# Patient Record
Sex: Male | Born: 1975
Health system: Southern US, Community
[De-identification: ages and names within clinical notes are randomized; demographics above are authoritative.]

## PROBLEM LIST (undated history)

## (undated) DIAGNOSIS — M1990 Unspecified osteoarthritis, unspecified site: Secondary | ICD-10-CM

## (undated) DIAGNOSIS — K219 Gastro-esophageal reflux disease without esophagitis: Secondary | ICD-10-CM

## (undated) DIAGNOSIS — I10 Essential (primary) hypertension: Secondary | ICD-10-CM

## (undated) HISTORY — DX: Unspecified osteoarthritis, unspecified site: M19.90

## (undated) HISTORY — DX: Gastro-esophageal reflux disease without esophagitis: K21.9

## (undated) HISTORY — PX: WRIST FRACTURE SURGERY: SHX121

---

## 1999-03-01 ENCOUNTER — Emergency Department (HOSPITAL_COMMUNITY): Admission: EM | Admit: 1999-03-01 | Discharge: 1999-03-01 | Payer: Self-pay | Admitting: Emergency Medicine

## 2002-06-14 ENCOUNTER — Emergency Department (HOSPITAL_COMMUNITY): Admission: EM | Admit: 2002-06-14 | Discharge: 2002-06-14 | Payer: Self-pay | Admitting: Emergency Medicine

## 2002-06-14 ENCOUNTER — Encounter: Payer: Self-pay | Admitting: Emergency Medicine

## 2009-08-27 ENCOUNTER — Emergency Department (HOSPITAL_COMMUNITY): Admission: EM | Admit: 2009-08-27 | Discharge: 2009-08-28 | Payer: Self-pay | Admitting: Emergency Medicine

## 2010-03-16 ENCOUNTER — Emergency Department (HOSPITAL_COMMUNITY): Admission: EM | Admit: 2010-03-16 | Discharge: 2010-03-16 | Payer: Self-pay | Admitting: Emergency Medicine

## 2010-09-20 LAB — DIFFERENTIAL
Basophils Absolute: 0 10*3/uL (ref 0.0–0.1)
Basophils Relative: 0 % (ref 0–1)
Eosinophils Absolute: 0 10*3/uL (ref 0.0–0.7)
Eosinophils Relative: 1 % (ref 0–5)
Lymphocytes Relative: 32 % (ref 12–46)
Lymphs Abs: 2 10*3/uL (ref 0.7–4.0)
Monocytes Absolute: 0.3 10*3/uL (ref 0.1–1.0)
Monocytes Relative: 5 % (ref 3–12)
Neutro Abs: 3.8 10*3/uL (ref 1.7–7.7)
Neutrophils Relative %: 62 % (ref 43–77)

## 2010-09-20 LAB — COMPREHENSIVE METABOLIC PANEL
ALT: 21 U/L (ref 0–53)
AST: 28 U/L (ref 0–37)
Albumin: 4.1 g/dL (ref 3.5–5.2)
Alkaline Phosphatase: 84 U/L (ref 39–117)
BUN: 8 mg/dL (ref 6–23)
CO2: 24 mEq/L (ref 19–32)
Calcium: 8.5 mg/dL (ref 8.4–10.5)
Chloride: 104 mEq/L (ref 96–112)
Creatinine, Ser: 1.05 mg/dL (ref 0.4–1.5)
GFR calc Af Amer: >60 mL/min (ref >60)
GFR calc non Af Amer: >60 mL/min (ref >60)
Glucose, Bld: 117 mg/dL — ABNORMAL HIGH (ref 70–99)
Potassium: 3.5 mEq/L (ref 3.5–5.1)
Sodium: 137 mEq/L (ref 135–145)
Total Bilirubin: 0.7 mg/dL (ref 0.3–1.2)
Total Protein: 7.4 g/dL (ref 6.0–8.3)

## 2010-09-20 LAB — CBC
HCT: 38.8 % — ABNORMAL LOW (ref 39.0–52.0)
Hemoglobin: 13.5 g/dL (ref 13.0–17.0)
MCHC: 34.7 g/dL (ref 30.0–36.0)
MCV: 94.7 fL (ref 78.0–100.0)
Platelets: 217 10*3/uL (ref 150–400)
RBC: 4.1 MIL/uL — ABNORMAL LOW (ref 4.22–5.81)
RDW: 14.3 % (ref 11.5–15.5)
WBC: 6.2 10*3/uL (ref 4.0–10.5)

## 2010-09-20 LAB — ETHANOL: Alcohol, Ethyl (B): 226 mg/dL — ABNORMAL HIGH (ref 0–10)

## 2010-09-20 LAB — APTT: aPTT: 29 seconds (ref 24–37)

## 2010-11-22 ENCOUNTER — Ambulatory Visit (INDEPENDENT_AMBULATORY_CARE_PROVIDER_SITE_OTHER): Payer: Managed Care, Other (non HMO) | Admitting: Internal Medicine

## 2010-11-22 ENCOUNTER — Encounter: Payer: Self-pay | Admitting: Internal Medicine

## 2010-11-22 VITALS — BP 148/100 | HR 85 | Ht 70.0 in | Wt 218.0 lb

## 2010-11-22 DIAGNOSIS — Z0000 Encounter for general adult medical examination without abnormal findings: Secondary | ICD-10-CM

## 2010-11-22 DIAGNOSIS — Z23 Encounter for immunization: Secondary | ICD-10-CM

## 2010-11-22 LAB — BASIC METABOLIC PANEL
CO2: 28 mEq/L (ref 19–32)
Calcium: 9.4 mg/dL (ref 8.4–10.5)
GFR: 97.77 mL/min (ref >60.00)
Sodium: 140 mEq/L (ref 135–145)

## 2010-11-22 LAB — LIPID PANEL
Cholesterol: 205 mg/dL — ABNORMAL HIGH (ref 0–200)
HDL: 67.1 mg/dL (ref >39.00)
Triglycerides: 95 mg/dL (ref 0.0–149.0)

## 2010-11-23 LAB — LDL CHOLESTEROL, DIRECT: Direct LDL: 147.1 mg/dL

## 2010-11-30 ENCOUNTER — Telehealth: Payer: Self-pay

## 2010-11-30 NOTE — Telephone Encounter (Signed)
Left message for pt to call back  °

## 2010-11-30 NOTE — Telephone Encounter (Signed)
Message copied by Beverely Low on Fri Nov 30, 2010  2:18 PM ------      Message from: Staci Righter      Created: Thu Nov 29, 2010  9:18 PM       Chol mildly elevated. Low fat diet and regular exercise.

## 2010-11-30 NOTE — Telephone Encounter (Signed)
Pt's wife aware.

## 2010-11-30 NOTE — Telephone Encounter (Signed)
Message copied by Beverely Low on Fri Nov 30, 2010  3:51 PM ------      Message from: Staci Righter      Created: Thu Nov 29, 2010  9:18 PM       Chol mildly elevated. Low fat diet and regular exercise.

## 2010-12-02 ENCOUNTER — Encounter: Payer: Self-pay | Admitting: Internal Medicine

## 2010-12-02 DIAGNOSIS — Z0000 Encounter for general adult medical examination without abnormal findings: Secondary | ICD-10-CM | POA: Insufficient documentation

## 2010-12-02 NOTE — Assessment & Plan Note (Signed)
Normal exam. Reviewed normal CBC Chem-12 ESR and chest x-ray dated September 2011. Obtain Chem-7 and fasting lipid profile today. Tetanus booster provided.

## 2010-12-02 NOTE — Progress Notes (Signed)
  Subjective:    Patient ID: Eugene Franco, male    DOB: 1976/05/01, 35 y.o.   MRN: [id number]  HPI Pt presents to clinic to establish primary care and for CPE. meds history of intermittent low back pain with associated stiffness. No history of injury, or radicular pain, numbness tingling or leg weakness. Currently asymptomatic. Back x-ray dated 2003 demonstrated DDD of L4 and L5 as well as L5 and S1 and mild scoliosis. Currently is asymptomatic and is without complaint.  Reviewed past medical history, past surgical history, medications, allergies, social history and family history    Review of Systems  Musculoskeletal: Positive for back pain.  Neurological: Negative for weakness and numbness.  [all other systems reviewed and are negative       Objective:   Physical Exam    Physical Exam  Vitals reviewed. Constitutional:  appears well-developed and well-nourished. No distress.  HENT:  Head: Normocephalic and atraumatic.  Right Ear: Tympanic membrane, external ear and ear canal normal.  Left Ear: Tympanic membrane, external ear and ear canal normal.  Nose: Nose normal.  Mouth/Throat: Oropharynx is clear and moist. No oropharyngeal exudate.  Eyes: Conjunctivae and EOM are normal. Pupils are equal, round, and reactive to light. Right eye exhibits no discharge. Left eye exhibits no discharge. No scleral icterus.  Neck: Neck supple. No thyromegaly present. No carotid bruits Cardiovascular: Normal rate, regular rhythm and normal heart sounds.  Exam reveals no gallop and no friction rub.   No murmur heard. Pulmonary/Chest: Effort normal and breath sounds normal. No respiratory distress.  has no wheezes.  has no rales.  Abdomen: Soft, nondistended, nontender to palpation, positive bowel sounds. No masses organomegaly. Lymphadenopathy:   no cervical adenopathy.  Neurological:  is alert.  Skin: Skin is warm and dry.  not diaphoretic.  Psychiatric: normal mood and affect.       Assessment & Plan:

## 2010-12-27 ENCOUNTER — Ambulatory Visit: Payer: Managed Care, Other (non HMO) | Admitting: Internal Medicine

## 2010-12-28 ENCOUNTER — Ambulatory Visit (INDEPENDENT_AMBULATORY_CARE_PROVIDER_SITE_OTHER): Payer: Managed Care, Other (non HMO) | Admitting: Internal Medicine

## 2010-12-28 ENCOUNTER — Encounter: Payer: Self-pay | Admitting: Internal Medicine

## 2010-12-28 DIAGNOSIS — F17200 Nicotine dependence, unspecified, uncomplicated: Secondary | ICD-10-CM

## 2010-12-28 DIAGNOSIS — Z7289 Other problems related to lifestyle: Secondary | ICD-10-CM

## 2010-12-28 DIAGNOSIS — M549 Dorsalgia, unspecified: Secondary | ICD-10-CM

## 2010-12-28 DIAGNOSIS — I10 Essential (primary) hypertension: Secondary | ICD-10-CM

## 2010-12-28 DIAGNOSIS — Z789 Other specified health status: Secondary | ICD-10-CM

## 2010-12-28 MED ORDER — AMLODIPINE BESYLATE 5 MG PO TABS: 5.0000 mg | ORAL_TABLET | Freq: Every day | ORAL | Status: DC

## 2010-12-30 DIAGNOSIS — M549 Dorsalgia, unspecified: Secondary | ICD-10-CM | POA: Insufficient documentation

## 2010-12-30 DIAGNOSIS — I10 Essential (primary) hypertension: Secondary | ICD-10-CM | POA: Insufficient documentation

## 2010-12-30 DIAGNOSIS — F17200 Nicotine dependence, unspecified, uncomplicated: Secondary | ICD-10-CM | POA: Insufficient documentation

## 2010-12-30 DIAGNOSIS — Z789 Other specified health status: Secondary | ICD-10-CM | POA: Insufficient documentation

## 2010-12-30 NOTE — Assessment & Plan Note (Signed)
Appears to be muscle skeletal. OTC NSAIDs p.r.n.

## 2010-12-30 NOTE — Assessment & Plan Note (Signed)
Asymptomatic. Begin Norvasc 5 mg daily. Monitor blood pressure as an outpatient and followup in clinic as scheduled. Suspect possible contribution from alcohol use.

## 2010-12-30 NOTE — Assessment & Plan Note (Signed)
Long discussion held regarding potential morbidity of continued regular alcohol use. Strongly recommended weaning over the next several weeks ultimately to off. States understanding and agreement

## 2010-12-30 NOTE — Progress Notes (Signed)
  Subjective:    Patient ID: Eugene Franco, male    DOB: 1975-07-17, 35 y.o.   MRN: [id number]  HPI Patient presents to clinic for evaluation of hypertension. Blood pressure remains elevated without symptoms of headache, dizziness, shortness of breath or chest pain. Currently taking no medication. Does smoke tobacco and admits to drinking approximately 3-4 beers daily for at least several years. No apparent history of DTs or withdrawal. Has no anxiety or agitation today. Also has chronic intermittent back pain which appears to be unilateral lumbar without radicular symptoms, paresthesias weakness injury or trauma. The pain occurs worsens with activity. No current complaints. Discussed tobacco cessation for approximately 5 minutes including potential morbidity of continued tobacco use as well as potential cessation treatments.  Reviewed past medical history, medications and allergies    Review of Systems see history of present illness     Objective:   Physical Exam  Nursing note and vitals reviewed. Constitutional: He appears well-developed and well-nourished. No distress.  HENT:  Head: Normocephalic and atraumatic.  Right Ear: External ear normal.  Left Ear: External ear normal.  Nose: Nose normal.  Eyes: Conjunctivae are normal. No scleral icterus.  Musculoskeletal: Normal range of motion.       No lumbar sacral tenderness or bony abnormality. No paraspinal muscle spasm. Gait normal.  Neurological: He is alert.  Skin: Skin is warm and dry. No rash noted. He is not diaphoretic. No erythema.  Psychiatric: He has a normal mood and affect. His behavior is normal.          Assessment & Plan:

## 2010-12-30 NOTE — Assessment & Plan Note (Signed)
Counseled regarding the need for cessation. States understanding 

## 2013-02-08 ENCOUNTER — Emergency Department (HOSPITAL_COMMUNITY)
Admission: EM | Admit: 2013-02-08 | Discharge: 2013-02-08 | Disposition: A | Discharge status: Home / Self Care | Payer: 59 | Attending: Emergency Medicine | Admitting: Emergency Medicine

## 2013-02-08 ENCOUNTER — Encounter (HOSPITAL_COMMUNITY): Payer: Self-pay | Admitting: Emergency Medicine

## 2013-02-08 DIAGNOSIS — M129 Arthropathy, unspecified: Secondary | ICD-10-CM | POA: Insufficient documentation

## 2013-02-08 DIAGNOSIS — K219 Gastro-esophageal reflux disease without esophagitis: Secondary | ICD-10-CM | POA: Insufficient documentation

## 2013-02-08 DIAGNOSIS — I10 Essential (primary) hypertension: Secondary | ICD-10-CM | POA: Insufficient documentation

## 2013-02-08 DIAGNOSIS — K644 Residual hemorrhoidal skin tags: Secondary | ICD-10-CM | POA: Insufficient documentation

## 2013-02-08 DIAGNOSIS — F17200 Nicotine dependence, unspecified, uncomplicated: Secondary | ICD-10-CM | POA: Insufficient documentation

## 2013-02-08 HISTORY — DX: Essential (primary) hypertension: I10

## 2013-02-08 MED ORDER — HYDROCODONE-ACETAMINOPHEN 5-325 MG PO TABS: 2.0000 | ORAL_TABLET | ORAL | Status: DC | PRN

## 2013-02-08 MED ORDER — HYDROCORTISONE 2.5 % RE CREA: TOPICAL_CREAM | RECTAL | Status: DC

## 2013-02-08 MED ORDER — DOCUSATE SODIUM 100 MG PO CAPS: 100.0000 mg | ORAL_CAPSULE | Freq: Two times a day (BID) | ORAL | Status: DC

## 2013-02-08 NOTE — ED Notes (Signed)
Pt states he has had pain around his rectum for one week. He believes it is a bullae or a hemorrhoid.

## 2013-02-08 NOTE — ED Provider Notes (Signed)
CSN: [id number]     Arrival date & time 02/08/13  2014 History     First MD Initiated Contact with Patient 02/08/13 2218     Chief Complaint  Patient presents with  . Abscess    HPI   Patient has had perirectal pain for about a week. He had an incision and drainage of what sounds like a perirectal abscess a few months ago. He states that they got "infection" no history of hemorrhoids. No bleeding possible rectal pain Past Medical History  Diagnosis Date  . GERD (gastroesophageal reflux disease)   . Arthritis   . Hypertension    History reviewed. No pertinent past surgical history. Family History  Problem Relation Age of Onset  . Arthritis Mother   . Arthritis Father   . Cancer Sister     breast  . Arthritis Maternal Grandmother   . Hypertension Maternal Grandmother   . Arthritis Maternal Grandfather   . Hypertension Maternal Grandfather    History  Substance Use Topics  . Smoking status: Current Every Day Smoker -- 0.50 packs/day    Types: Cigarettes  . Smokeless tobacco: Not on file  . Alcohol Use: Yes    Review of Systems  Constitutional: Negative for fever, chills, diaphoresis, appetite change and fatigue.  HENT: Negative for sore throat, mouth sores and trouble swallowing.   Eyes: Negative for visual disturbance.  Respiratory: Negative for cough, chest tightness, shortness of breath and wheezing.   Cardiovascular: Negative for chest pain.  Gastrointestinal: Negative for nausea, vomiting, abdominal pain, diarrhea and abdominal distention.       Peri rectal pain  Endocrine: Negative for polydipsia, polyphagia and polyuria.  Genitourinary: Negative for dysuria, frequency and hematuria.  Musculoskeletal: Negative for gait problem.  Skin: Negative for color change, pallor and rash.  Neurological: Negative for dizziness, syncope, light-headedness and headaches.  Hematological: Does not bruise/bleed easily.  Psychiatric/Behavioral: Negative for behavioral problems  and confusion.    Allergies  Review of patient's allergies indicates no known allergies.  Home Medications   Current Outpatient Rx  Name  Route  Sig  Dispense  Refill  . docusate sodium (COLACE) 100 MG capsule   Oral   Take 1 capsule (100 mg total) by mouth every 12 (twelve) hours.   60 capsule   0   . HYDROcodone-acetaminophen (NORCO/VICODIN) 5-325 MG per tablet   Oral   Take 2 tablets by mouth every 4 (four) hours as needed for pain.   10 tablet   0   . hydrocortisone (ANUSOL-HC) 2.5 % rectal cream      Apply rectally 2 times daily   30 g   1    BP 121/78  Pulse 52  Temp(Src) 97.7 F (36.5 C) (Oral)  Resp 12  SpO2 99% Physical Exam Is awake alert in laying prone the HEENT exam is normal and no conjunctivae injection or illness who is well-hydrated abdomen soft nondistended nontender normal scrotal exam her rectal exam shows a ulcerated but nonthrombosed external hemorrhoid at the 6 4:00 position. No apparent other areas of induration or fluctuance no sign of perirectal abscess.  The area was injected with local anesthetic and the needle was placed into the area of hemorrhoid. Supple bright red blood was aspirated. I would ensure that this was not an infection in addition to the hemorrhoid as well I do not aspirate any purulence.to suggest perirectal abscess ED Course   Procedures (including critical care time)  Labs Reviewed - No data to display No  results found. 1. External hemorrhoid     MDM  Differential diagnosis ulcerated but nonthrombosed external hemorrhoid versus perirectal abscess aspiration shows blood therefore diagnosis is nonthrombosed external hemorrhoid  Claudean Kinds, MD 02/08/13 2341

## 2013-02-08 NOTE — ED Notes (Signed)
PT. REPORTS ABSCESS AT INNER BUTTOCK ONSET 2 DAYS AGO WITH NO DRAINAGE.

## 2013-02-10 ENCOUNTER — Encounter (INDEPENDENT_AMBULATORY_CARE_PROVIDER_SITE_OTHER): Payer: Self-pay | Admitting: Surgery

## 2013-02-10 ENCOUNTER — Ambulatory Visit (INDEPENDENT_AMBULATORY_CARE_PROVIDER_SITE_OTHER): Payer: 59 | Admitting: Surgery

## 2013-02-10 VITALS — BP 136/80 | HR 68 | Temp 97.4°F | Resp 14 | Ht 71.0 in | Wt 195.8 lb

## 2013-02-10 DIAGNOSIS — K645 Perianal venous thrombosis: Secondary | ICD-10-CM

## 2013-02-10 MED ORDER — OXYCODONE-ACETAMINOPHEN 5-325 MG PO TABS: 1.0000 | ORAL_TABLET | ORAL | Status: DC | PRN

## 2013-02-10 NOTE — Progress Notes (Signed)
Patient ID: Eugene Franco, male   DOB: [birth year], 37 y.o.   MRN: [id number]  Chief Complaint  Patient presents with  . New Evaluation    eval throm hems    HPI Eugene Franco is a 37 y.o. male.  Referred from ED -  Dr. Rolland Porter for evaluation of thrombosed hemorrhoid HPI This is a 37 year old male in good health who had a previous infected or thrombosed hemorrhoid several months ago which was drained at an urgent care. He presents with a 2 week history of painful progressive swelling on the left side of his anus. This has become fairly severe and he is unable to work. He was seen in the MRSA department 2 nights ago. A needle was placed into the hemorrhoid and blood was aspirated. This was used to rule out a perirectal abscess. The swelling has worsened and he comes arch office today for evaluation. He has been unable to have a bowel movement for the last couple of days. Past Medical History  Diagnosis Date  . GERD (gastroesophageal reflux disease)   . Arthritis   . Hypertension     History reviewed. No pertinent past surgical history.  Family History  Problem Relation Age of Onset  . Arthritis Mother   . Arthritis Father   . Cancer Sister     breast  . Arthritis Maternal Grandmother   . Hypertension Maternal Grandmother   . Arthritis Maternal Grandfather   . Hypertension Maternal Grandfather   . Cancer Sister     Social History History  Substance Use Topics  . Smoking status: Current Every Day Smoker -- 0.50 packs/day    Types: Cigarettes  . Smokeless tobacco: Never Used  . Alcohol Use: Yes    No Known Allergies  Current Outpatient Prescriptions  Medication Sig Dispense Refill  . docusate sodium (COLACE) 100 MG capsule Take 1 capsule (100 mg total) by mouth every 12 (twelve) hours.  60 capsule  0  . oxyCODONE-acetaminophen (PERCOCET/ROXICET) 5-325 MG per tablet Take 1 tablet by mouth every 4 (four) hours as needed for pain.  40 tablet  0   No current  facility-administered medications for this visit.    Review of Systems Review of Systems  Constitutional: Negative for fever, chills and unexpected weight change.  HENT: Negative for hearing loss, congestion, sore throat, trouble swallowing and voice change.   Eyes: Negative for visual disturbance.  Respiratory: Negative for cough and wheezing.   Cardiovascular: Negative for chest pain, palpitations and leg swelling.  Gastrointestinal: Positive for anal bleeding and rectal pain. Negative for nausea, vomiting, abdominal pain, diarrhea, constipation, blood in stool and abdominal distention.  Genitourinary: Negative for hematuria and difficulty urinating.  Musculoskeletal: Negative for arthralgias.  Skin: Negative for rash and wound.  Neurological: Negative for seizures, syncope, weakness and headaches.  Hematological: Negative for adenopathy. Does not bruise/bleed easily.  Psychiatric/Behavioral: Negative for confusion.    Blood pressure 136/80, pulse 68, temperature 97.4 F (36.3 C), temperature source Temporal, resp. rate 14, height 5\' 11"  (1.803 m), weight 195 lb 12.8 oz (88.814 kg).  Physical Exam Physical Exam WDWN - uncomfortable Rectal - moderate-sized left thrombosed external hemorrhoid; exquisitely tender  Data Reviewed none  Assessment    Thrombosed external hemorrhoid     Plan    The area was prepped with Betadine and anesthetized with 1% lidocaine. I made a round incision in the hemorrhoid. We expressed a large amount of clot with complete decompression of the hemorrhoid. Hemostasis was obtained with pressure.  The patient will continue with sitz baths. I gave him a prescription for Percocet. Followup in 2 weeks.         Katisha Shimizu K. 02/10/2013, 5:11 PM

## 2013-02-22 ENCOUNTER — Encounter (INDEPENDENT_AMBULATORY_CARE_PROVIDER_SITE_OTHER): Payer: 59 | Admitting: Surgery

## 2013-02-22 ENCOUNTER — Telehealth (INDEPENDENT_AMBULATORY_CARE_PROVIDER_SITE_OTHER): Payer: Self-pay | Admitting: General Surgery

## 2013-02-22 NOTE — Telephone Encounter (Signed)
Patient stated that he is feeling a lot better and will call back to the office if he has anymore problems with his hems. He know to call back and ask for [facility]

## 2015-05-18 ENCOUNTER — Emergency Department (HOSPITAL_COMMUNITY): Payer: Managed Care, Other (non HMO)

## 2015-05-18 ENCOUNTER — Emergency Department (HOSPITAL_COMMUNITY)
Admission: EM | Admit: 2015-05-18 | Discharge: 2015-05-18 | Disposition: A | Discharge status: Home / Self Care | Payer: Managed Care, Other (non HMO) | Attending: Emergency Medicine | Admitting: Emergency Medicine

## 2015-05-18 ENCOUNTER — Encounter (HOSPITAL_COMMUNITY): Payer: Self-pay | Admitting: *Deleted

## 2015-05-18 DIAGNOSIS — F17210 Nicotine dependence, cigarettes, uncomplicated: Secondary | ICD-10-CM | POA: Insufficient documentation

## 2015-05-18 DIAGNOSIS — R0789 Other chest pain: Secondary | ICD-10-CM | POA: Insufficient documentation

## 2015-05-18 DIAGNOSIS — Z8719 Personal history of other diseases of the digestive system: Secondary | ICD-10-CM | POA: Insufficient documentation

## 2015-05-18 DIAGNOSIS — Z791 Long term (current) use of non-steroidal anti-inflammatories (NSAID): Secondary | ICD-10-CM | POA: Insufficient documentation

## 2015-05-18 DIAGNOSIS — M1990 Unspecified osteoarthritis, unspecified site: Secondary | ICD-10-CM | POA: Insufficient documentation

## 2015-05-18 DIAGNOSIS — I10 Essential (primary) hypertension: Secondary | ICD-10-CM | POA: Insufficient documentation

## 2015-05-18 LAB — BASIC METABOLIC PANEL
Anion gap: 10 (ref 5–15)
BUN: 8 mg/dL (ref 6–20)
CO2: 27 mmol/L (ref 22–32)
Calcium: 9.4 mg/dL (ref 8.9–10.3)
Chloride: 102 mmol/L (ref 101–111)
Creatinine, Ser: 0.83 mg/dL (ref 0.61–1.24)
GFR calc Af Amer: >60 mL/min (ref >60)
GFR calc non Af Amer: >60 mL/min (ref >60)
Glucose, Bld: 97 mg/dL (ref 65–99)
Potassium: 3.7 mmol/L (ref 3.5–5.1)
Sodium: 139 mmol/L (ref 135–145)

## 2015-05-18 LAB — CBC WITH DIFFERENTIAL/PLATELET
Basophils Absolute: 0 10*3/uL (ref 0.0–0.1)
Basophils Relative: 0 %
Eosinophils Absolute: 0.1 10*3/uL (ref 0.0–0.7)
Eosinophils Relative: 1 %
HCT: 41.3 % (ref 39.0–52.0)
Hemoglobin: 14.2 g/dL (ref 13.0–17.0)
Lymphocytes Relative: 29 %
Lymphs Abs: 1.6 10*3/uL (ref 0.7–4.0)
MCH: 31.4 pg (ref 26.0–34.0)
MCHC: 34.4 g/dL (ref 30.0–36.0)
MCV: 91.4 fL (ref 78.0–100.0)
Monocytes Absolute: 0.4 10*3/uL (ref 0.1–1.0)
Monocytes Relative: 7 %
Neutro Abs: 3.5 10*3/uL (ref 1.7–7.7)
Neutrophils Relative %: 63 %
Platelets: 217 10*3/uL (ref 150–400)
RBC: 4.52 MIL/uL (ref 4.22–5.81)
RDW: 13.6 % (ref 11.5–15.5)
WBC: 5.5 10*3/uL (ref 4.0–10.5)

## 2015-05-18 LAB — TROPONIN I: Troponin I: <0.03 ng/mL (ref <0.031)

## 2015-05-18 MED ORDER — MELOXICAM 15 MG PO TABS: 15.0000 mg | ORAL_TABLET | Freq: Every day | ORAL | Status: DC

## 2015-05-18 MED ORDER — DEXAMETHASONE 4 MG PO TABS
12.0000 mg | ORAL_TABLET | Freq: Once | ORAL | Status: AC
  Administered 2015-05-18: 12 mg via ORAL
  Filled 2015-05-18: qty 3

## 2015-05-18 MED ORDER — KETOROLAC TROMETHAMINE 30 MG/ML IJ SOLN
30.0000 mg | Freq: Once | INTRAMUSCULAR | Status: AC
  Administered 2015-05-18: 30 mg via INTRAMUSCULAR
  Filled 2015-05-18: qty 1

## 2015-05-18 NOTE — Discharge Instructions (Signed)

## 2015-05-18 NOTE — ED Notes (Signed)
Pt states pain in left rib x 1 1/2 weeks.  Pt states not aware of any injury but works in tight inclosed space and may have injured then.  Pt states pain 10/10 pinching pain that is not getting any better.  No other medical complaints.

## 2015-06-02 NOTE — ED Provider Notes (Signed)
CSN: [id number]     Arrival date & time 05/18/15  0757 History   First MD Initiated Contact with Patient 05/18/15 [phone]     Chief Complaint  Patient presents with  . Rib Injury     (Consider location/radiation/quality/duration/timing/severity/associated sxs/prior Treatment) HPI  39 year old male with left-sided chest pain. Symptom onset about a week ago. Severe pain in the left side of his chest. Worse with movements and deep breathing. Describes pain as pinching/stabbing. Denies any specific trauma. No fevers or chills. No cough. No unusual leg pain or swelling. No history similar type pain.  Past Medical History  Diagnosis Date  . GERD (gastroesophageal reflux disease)   . Arthritis   . Hypertension    History reviewed. No pertinent past surgical history. Family History  Problem Relation Age of Onset  . Arthritis Mother   . Arthritis Father   . Cancer Sister     breast  . Arthritis Maternal Grandmother   . Hypertension Maternal Grandmother   . Arthritis Maternal Grandfather   . Hypertension Maternal Grandfather   . Cancer Sister    Social History  Substance Use Topics  . Smoking status: Current Every Day Smoker -- 0.50 packs/day    Types: Cigarettes  . Smokeless tobacco: Never Used  . Alcohol Use: 1.2 oz/week    2 Cans of beer per week    Review of Systems  All systems reviewed and negative, other than as noted in HPI.   Allergies  Review of patient's allergies indicates no known allergies.  Home Medications   Prior to Admission medications   Medication Sig Start Date End Date Taking? Authorizing Provider  Aspirin-Caffeine (BAYER BACK & BODY PAIN EX ST PO) Take 1 tablet by mouth 3 (three) times daily as needed. For pain   Yes Historical Provider, MD  docusate sodium (COLACE) 100 MG capsule Take 1 capsule (100 mg total) by mouth every 12 (twelve) hours. Patient not taking: Reported on 05/18/2015 02/08/13   Rolland PorterMark Shawna, MD  meloxicam (MOBIC) 15 MG tablet Take 1  tablet (15 mg total) by mouth daily. 05/18/15   Raeford RazorStephen Tedrick Port, MD  oxyCODONE-acetaminophen (PERCOCET/ROXICET) 5-325 MG per tablet Take 1 tablet by mouth every 4 (four) hours as needed for pain. Patient not taking: Reported on 05/18/2015 02/10/13   Manus RuddMatthew Tsuei, MD   BP 156/113 mmHg  Pulse 57  Temp(Src) 98.5 F (36.9 C) (Oral)  Resp 13  Ht 5\' 11"  (1.803 m)  Wt 195 lb (88.451 kg)  BMI 27.21 kg/m2  SpO2 100% Physical Exam  Constitutional: He appears well-developed and well-nourished. No distress.  HENT:  Head: Normocephalic and atraumatic.  Eyes: Conjunctivae are normal. Right eye exhibits no discharge. Left eye exhibits no discharge.  Neck: Neck supple.  Cardiovascular: Normal rate, regular rhythm and normal heart sounds.  Exam reveals no gallop and no friction rub.   No murmur heard. Pulmonary/Chest: Effort normal and breath sounds normal. No respiratory distress. He exhibits tenderness.  Tenderness to palpation along the costal margin left chest wall near the anterior to midaxillary line. No crepitus. No overlying skin changes. Abdomen is benign.  Abdominal: Soft. He exhibits no distension. There is no tenderness.  Musculoskeletal: He exhibits no edema or tenderness.  Neurological: He is alert.  Skin: Skin is warm and dry.  Psychiatric: He has a normal mood and affect. His behavior is normal. Thought content normal.  Nursing note and vitals reviewed.   ED Course  Procedures (including critical care time) Labs Review Labs Reviewed  CBC WITH DIFFERENTIAL/PLATELET  BASIC METABOLIC PANEL  TROPONIN I    Imaging Review No results found. I have personally reviewed and evaluated these images and lab results as part of my medical decision-making.   EKG Interpretation   Date/Time:  Thursday May 18 2015 09:22:30 EST Ventricular Rate:  62 PR Interval:  149 QRS Duration: 96 QT Interval:  431 QTC Calculation: 438 R Axis:   -17 Text Interpretation:  Sinus rhythm Borderline  left axis deviation ED  PHYSICIAN INTERPRETATION AVAILABLE IN [facility] Confirmed by TEST,  Record ([id number]) on 05/19/2015 7:04:48 AM      MDM   Final diagnoses:  Left-sided chest wall pain    39 year old male with left chest pain which is most consistent with chest wall pain. Very reproducible with palpation and movement. No specific respiratory complaints. No cough. Is not tachycardic. Oxygen saturations are normal on room air.    Raeford Razor, MD 06/02/15 [phone]

## 2016-07-30 ENCOUNTER — Emergency Department (HOSPITAL_COMMUNITY)
Admission: EM | Admit: 2016-07-30 | Discharge: 2016-07-30 | Disposition: A | Discharge status: Home / Self Care | Payer: Managed Care, Other (non HMO) | Attending: Emergency Medicine | Admitting: Emergency Medicine

## 2016-07-30 ENCOUNTER — Emergency Department (HOSPITAL_COMMUNITY)
Admission: EM | Admit: 2016-07-30 | Discharge: 2016-07-30 | Disposition: A | Discharge status: Home / Self Care | Payer: Managed Care, Other (non HMO) | Attending: Dermatology | Admitting: Dermatology

## 2016-07-30 ENCOUNTER — Encounter (HOSPITAL_COMMUNITY): Payer: Self-pay | Admitting: Emergency Medicine

## 2016-07-30 DIAGNOSIS — Z7982 Long term (current) use of aspirin: Secondary | ICD-10-CM | POA: Insufficient documentation

## 2016-07-30 DIAGNOSIS — Z79899 Other long term (current) drug therapy: Secondary | ICD-10-CM | POA: Insufficient documentation

## 2016-07-30 DIAGNOSIS — I10 Essential (primary) hypertension: Secondary | ICD-10-CM | POA: Insufficient documentation

## 2016-07-30 DIAGNOSIS — Z5321 Procedure and treatment not carried out due to patient leaving prior to being seen by health care provider: Secondary | ICD-10-CM | POA: Insufficient documentation

## 2016-07-30 DIAGNOSIS — F17210 Nicotine dependence, cigarettes, uncomplicated: Secondary | ICD-10-CM | POA: Insufficient documentation

## 2016-07-30 DIAGNOSIS — F1010 Alcohol abuse, uncomplicated: Secondary | ICD-10-CM

## 2016-07-30 MED ORDER — CHLORDIAZEPOXIDE HCL 25 MG PO CAPS: ORAL_CAPSULE | ORAL | 0 refills | Status: DC

## 2016-07-30 MED ORDER — ONDANSETRON 4 MG PO TBDP: 4.0000 mg | ORAL_TABLET | Freq: Three times a day (TID) | ORAL | 0 refills | Status: AC | PRN

## 2016-07-30 MED ORDER — AMLODIPINE BESYLATE 5 MG PO TABS: 5.0000 mg | ORAL_TABLET | Freq: Every day | ORAL | 0 refills | Status: DC

## 2016-07-30 NOTE — ED Triage Notes (Signed)
Patient is looking for placement for alcohol treatment.  Denies thoughts of harming himself or anyone else.

## 2016-07-30 NOTE — ED Provider Notes (Signed)
[facility] Provider Note   CSN: [id number] Arrival date & time: 07/30/16  4098  By signing my name below, I, Soijett Blue, attest that this documentation has been prepared under the direction and in the presence of Sharilyn Sites, PA-C Electronically Signed: Soijett Blue, ED Scribe. 07/30/16. 11:00 AM.  History   Chief Complaint No chief complaint on file.   HPI Eugene Franco is a 41 y.o. male with a PMHx of HTN, who presents to the Emergency Department complaining of seeking alcohol detox onset today. He is having associated symptoms of nausea, night sweats, and feeling "jittery". Pt notes that he would consume a 6-12 pack of beer and a 1/5 of liquor daily for 10+ years. Last drink was yesterday around 11am.  Pt reports that he decided to go through detox due to family and his job. Pt notes that he would like to be in an in-patient program. Pt also states that he thinks some of his symptoms are due to him no longer taking his HTN medication (5 mg Norvasc) for the past year. He has not tried any medications for the relief of his symptoms. He denies vomiting, fever, chills, chest pain, SOB, and any other symptoms. Denies illegal drug use or being in a detox program in the past. Denies allergies to medications. Patient denies any suicidal or homicidal ideation. No hallucinations. Patient's wife is present at the bedside who is supportive of his decision for treatment.  The history is provided by the patient. No language interpreter was used.    Past Medical History:  Diagnosis Date  . Arthritis   . GERD (gastroesophageal reflux disease)   . Hypertension     Patient Active Problem List   Diagnosis Date Noted  . Thrombosed external hemorrhoid 02/10/2013  . Hypertension 12/30/2010  . Alcohol ingestion of one to four drinks per day 12/30/2010  . Tobacco use disorder 12/30/2010  . Back pain 12/30/2010  . Annual physical exam 12/02/2010    History reviewed. No pertinent surgical  history.     Home Medications    Prior to Admission medications   Medication Sig Start Date End Date Taking? Authorizing Provider  Aspirin-Caffeine (BAYER BACK & BODY PAIN EX ST PO) Take 1 tablet by mouth 3 (three) times daily as needed. For pain    Historical Provider, MD  docusate sodium (COLACE) 100 MG capsule Take 1 capsule (100 mg total) by mouth every 12 (twelve) hours. Patient not taking: Reported on 05/18/2015 02/08/13   Rolland Porter, MD  meloxicam (MOBIC) 15 MG tablet Take 1 tablet (15 mg total) by mouth daily. 05/18/15   Raeford Razor, MD  oxyCODONE-acetaminophen (PERCOCET/ROXICET) 5-325 MG per tablet Take 1 tablet by mouth every 4 (four) hours as needed for pain. Patient not taking: Reported on 05/18/2015 02/10/13   Manus Rudd, MD    Family History Family History  Problem Relation Age of Onset  . Arthritis Mother   . Arthritis Father   . Cancer Sister     breast  . Cancer Sister   . Arthritis Maternal Grandmother   . Hypertension Maternal Grandmother   . Arthritis Maternal Grandfather   . Hypertension Maternal Grandfather     Social History Social History  Substance Use Topics  . Smoking status: Current Every Day Smoker    Packs/day: 0.50    Types: Cigarettes  . Smokeless tobacco: Never Used  . Alcohol use 1.2 oz/week    2 Cans of beer per week     Allergies  Patient has no known allergies.   Review of Systems Review of Systems  Constitutional: Negative for chills and fever.       +night sweats  Gastrointestinal: Positive for nausea. Negative for vomiting.  All other systems reviewed and are negative.   Physical Exam Updated Vital Signs BP (!) 157/106   Pulse 63   Temp 98.2 F (36.8 C) (Oral)   Resp 16   Ht 5\' 11"  (1.803 m)   Wt 195 lb (88.5 kg)   SpO2 98%   BMI 27.20 kg/m   Physical Exam  Constitutional: He is oriented to person, place, and time. He appears well-developed and well-nourished.  HENT:  Head: Normocephalic and atraumatic.   Mouth/Throat: Oropharynx is clear and moist.  Eyes: Conjunctivae and EOM are normal. Pupils are equal, round, and reactive to light.  Neck: Normal range of motion.  Cardiovascular: Normal rate, regular rhythm and normal heart sounds.  Exam reveals no friction rub.   No murmur heard. Pulmonary/Chest: Effort normal and breath sounds normal. No respiratory distress. He has no rales.  Abdominal: Soft. Bowel sounds are normal.  Musculoskeletal: Normal range of motion.  Neurological: He is alert and oriented to person, place, and time.  AAOx3, answering questions and following commands appropriately; equal strength UE and LE bilaterally; CN grossly intact; moves all extremities appropriately without ataxia; no focal neuro deficits or facial asymmetry appreciated; no tremors or seizure activity  Skin: Skin is warm and dry.  Psychiatric: He has a normal mood and affect.  Nursing note and vitals reviewed.   ED Treatments / Results  DIAGNOSTIC STUDIES: Oxygen Saturation is 98% on RA, nl by my interpretation.    COORDINATION OF CARE: 11:00 AM Discussed treatment plan with pt at bedside which includes detox program resources, librium, zofran, norvasc, and pt agreed to plan.   Procedures Procedures (including critical care time)  Medications Ordered in ED Medications - No data to display   Initial Impression / Assessment and Plan / ED Course  I have reviewed the triage vital signs and the nursing notes.  41 year old male here requesting alcohol detox. He has been drinking daily for the past 10+ years.  He is afebrile and nontoxic. His blood pressure is elevated slightly, however has been off of his medications for a year. No chest pain or SOB.  He is alert and oriented 3. Has no focal deficits and is not displaying any tremors or seizure activity here. He denies any suicidal or homicidal ideation. No hallucinations. He has good support system in his wife is here with him at the bedside. He  does not appear to be in acute withdrawal or danger to himself or others at this time. Feel he is stable for outpatient treatment. He was started on Librium taper and I have restarted his blood pressure medications. He was given outpatient resources in which to start his detox program. Wife is present and states she will assist with this.  Encouraged PCP follow-up as well.  Discussed plan with patient, he/she acknowledged understanding and agreed with plan of care.  Return precautions given for new or worsening symptoms.  Final Clinical Impressions(s) / ED Diagnoses   Final diagnoses:  Alcohol abuse    New Prescriptions New Prescriptions   AMLODIPINE (NORVASC) 5 MG TABLET    Take 1 tablet (5 mg total) by mouth daily.   CHLORDIAZEPOXIDE (LIBRIUM) 25 MG CAPSULE    50mg  PO TID x 1D, then 25-50mg  PO BID X 1D, then 25-50mg  PO QD  X 1D   ONDANSETRON (ZOFRAN ODT) 4 MG DISINTEGRATING TABLET    Take 1 tablet (4 mg total) by mouth every 8 (eight) hours as needed for nausea.   I personally performed the services described in this documentation, which was scribed in my presence. The recorded information has been reviewed and is accurate.   Garlon HatchetLisa M Gissella Niblack, PA-C 07/30/16 1111    Gwyneth SproutWhitney Plunkett, MD 07/31/16 2133

## 2016-07-30 NOTE — Discharge Instructions (Signed)
Take the prescribed medication as directed.  Recommend to call the local treatment facilities today to see if there is a space open. Follow-up with your primary care doctor as well. Return to the ED for new or worsening symptoms.

## 2016-07-30 NOTE — ED Notes (Signed)
Patient states his last alcoholic drink was 07-29-16 at 11 am

## 2016-07-30 NOTE — ED Notes (Signed)
[facility] is going [facility]

## 2016-08-02 ENCOUNTER — Encounter (HOSPITAL_COMMUNITY): Payer: Self-pay | Admitting: Emergency Medicine

## 2016-08-02 ENCOUNTER — Emergency Department (HOSPITAL_COMMUNITY)
Admission: EM | Admit: 2016-08-02 | Discharge: 2016-08-02 | Disposition: A | Discharge status: Home / Self Care | Payer: Managed Care, Other (non HMO) | Attending: Emergency Medicine | Admitting: Emergency Medicine

## 2016-08-02 DIAGNOSIS — I10 Essential (primary) hypertension: Secondary | ICD-10-CM | POA: Insufficient documentation

## 2016-08-02 DIAGNOSIS — F1010 Alcohol abuse, uncomplicated: Secondary | ICD-10-CM

## 2016-08-02 DIAGNOSIS — F17210 Nicotine dependence, cigarettes, uncomplicated: Secondary | ICD-10-CM | POA: Insufficient documentation

## 2016-08-02 NOTE — ED Triage Notes (Addendum)
Pt request detox from alcohol. Pt verbalizes last drink 2330 last night. Pt continues to verbalizes on average 12 pack of beer or a pint to a fifth of liquor every day for past 10 or 15 years. Pt verbalizes "cannot afford it anymore so need to stop for wife and kids." Pt denies SI/HI. Pt verbalize symptoms of nervousness only. Pt hx of hypertension and has not taken prescribed medication regimen for "months."

## 2016-08-02 NOTE — ED Provider Notes (Signed)
[facility] Provider Note   CSN: [id number] Arrival date & time: 08/02/16  1530     History   Chief Complaint Chief Complaint  Patient presents with  . Detox    HPI Eugene Franco is a 41 y.o. male.  Eugene Franco is a 41 y.o. Male with a history of HTN and alcohol abuse who presents to the ED requesting alcohol detox. He reports drinking alcohol daily for 10 or more years. He reports his last drink was last night at 11:30 PM. He reports drinking a sixpack of beer and a few shots of liquor. He was seen in the emergency department 2 days ago and is provided with prescriptions for Librium and Norvasc. He had run out of his hypertension medications. He tells me he has not filled any of the prescriptions he was provided. He has not contacted any of the substance abuse treatment centers in the area. He tells me he thinks it'll be too expensive. He is unsure how much they cost and has not contacted any of them. He tells me at times he feels sweaty, but denies this now. He denies history of seizures or complex alcohol withdrawal in the past. He denies previous alcohol withdrawal. He denies physical complaints currently. He denies fevers, chest pain, shortness of breath, abdominal pain, nausea, vomiting, dizziness, syncope or rashes. He denies suicidal or homicidal ideations. He denies visual or auditory hallucinations.   The history is provided by the patient and medical records. No language interpreter was used.    Past Medical History:  Diagnosis Date  . Arthritis   . GERD (gastroesophageal reflux disease)   . Hypertension     Patient Active Problem List   Diagnosis Date Noted  . Thrombosed external hemorrhoid 02/10/2013  . Hypertension 12/30/2010  . Alcohol ingestion of one to four drinks per day 12/30/2010  . Tobacco use disorder 12/30/2010  . Back pain 12/30/2010  . Annual physical exam 12/02/2010    History reviewed. No pertinent surgical history.     Home  Medications    Prior to Admission medications   Medication Sig Start Date End Date Taking? Authorizing Provider  amLODipine (NORVASC) 5 MG tablet Take 1 tablet (5 mg total) by mouth daily. Patient not taking: Reported on 08/02/2016 07/30/16   Garlon Hatchet, PA-C  chlordiazePOXIDE (LIBRIUM) 25 MG capsule 50mg  PO TID x 1D, then 25-50mg  PO BID X 1D, then 25-50mg  PO QD X 1D Patient not taking: Reported on 08/02/2016 07/30/16   Garlon Hatchet, PA-C  docusate sodium (COLACE) 100 MG capsule Take 1 capsule (100 mg total) by mouth every 12 (twelve) hours. Patient not taking: Reported on 05/18/2015 02/08/13   Rolland Porter, MD  ondansetron (ZOFRAN ODT) 4 MG disintegrating tablet Take 1 tablet (4 mg total) by mouth every 8 (eight) hours as needed for nausea. Patient not taking: Reported on 08/02/2016 07/30/16   Garlon Hatchet, PA-C  oxyCODONE-acetaminophen (PERCOCET/ROXICET) 5-325 MG per tablet Take 1 tablet by mouth every 4 (four) hours as needed for pain. Patient not taking: Reported on 05/18/2015 02/10/13   Manus Rudd, MD    Family History Family History  Problem Relation Age of Onset  . Arthritis Mother   . Arthritis Father   . Cancer Sister     breast  . Cancer Sister   . Arthritis Maternal Grandmother   . Hypertension Maternal Grandmother   . Arthritis Maternal Grandfather   . Hypertension Maternal Grandfather     Social History Social History  Substance Use Topics  . Smoking status: Current Every Day Smoker    Packs/day: 0.25    Types: Cigarettes  . Smokeless tobacco: Never Used  . Alcohol use Yes     Allergies   Patient has no known allergies.   Review of Systems Review of Systems  Constitutional: Negative for fever.  HENT: Negative for sore throat.   Eyes: Negative for visual disturbance.  Respiratory: Negative for cough and shortness of breath.   Cardiovascular: Negative for chest pain.  Gastrointestinal: Negative for abdominal pain, diarrhea, nausea and vomiting.    Genitourinary: Negative for dysuria.  Musculoskeletal: Negative for back pain and neck pain.  Skin: Negative for rash.  Neurological: Negative for syncope and light-headedness.  Psychiatric/Behavioral: Negative for hallucinations and suicidal ideas. The patient is nervous/anxious.      Physical Exam Updated Vital Signs BP (!) 161/106   Pulse 74   Temp 97.6 F (36.4 C) (Oral)   Resp 18   SpO2 100%   Physical Exam  Constitutional: He is oriented to person, place, and time. He appears well-developed and well-nourished. No distress.  Nontoxic appearing.  HENT:  Head: Normocephalic and atraumatic.  Right Ear: External ear normal.  Left Ear: External ear normal.  Mucous membranes are moist.  Eyes: Conjunctivae are normal. Pupils are equal, round, and reactive to light. Right eye exhibits no discharge. Left eye exhibits no discharge.  Neck: Neck supple.  Cardiovascular: Normal rate, regular rhythm, normal heart sounds and intact distal pulses.   Pulmonary/Chest: Effort normal and breath sounds normal. No respiratory distress.  Abdominal: Soft. There is no tenderness. There is no guarding.  Musculoskeletal: He exhibits no tenderness.  Lymphadenopathy:    He has no cervical adenopathy.  Neurological: He is alert and oriented to person, place, and time. Coordination normal.  No tremors noted. Normal gait.  Skin: Skin is warm and dry. Capillary refill takes less than 2 seconds. No rash noted. He is not diaphoretic. No erythema. No pallor.  Psychiatric: His behavior is normal. His mood appears anxious. He does not exhibit a depressed mood. He expresses no homicidal and no suicidal ideation.  Nursing note and vitals reviewed.    ED Treatments / Results  Labs (all labs ordered are listed, but only abnormal results are displayed) Labs Reviewed - No data to display  EKG  EKG Interpretation None       Radiology No results found.  Procedures Procedures (including critical  care time)  Medications Ordered in ED Medications - No data to display   Initial Impression / Assessment and Plan / ED Course  I have reviewed the triage vital signs and the nursing notes.  Pertinent labs & imaging results that were available during my care of the patient were reviewed by me and considered in my medical decision making (see chart for details).    This is a 41 y.o. Male with a history of HTN and alcohol abuse who presents to the ED requesting alcohol detox. He reports drinking alcohol daily for 10 or more years. He reports his last drink was last night at 11:30 PM. He reports drinking a sixpack of beer and a few shots of liquor. He was seen in the emergency department 2 days ago and is provided with prescriptions for Librium and Norvasc. He had run out of his hypertension medications. He tells me he has not filled any of the prescriptions he was provided. He has not contacted any of the substance abuse treatment centers in the  area. He tells me he thinks it'll be too expensive. He is unsure how much they cost and has not contacted any of them. He denies history of seizures or complex alcohol withdrawal. He denies SI, HI, VH or AH.  On exam the patient is afebrile nontoxic appearing. No tremors noted. Normal gait. He does not appear clinically intoxicated. CIWA score is 4. Patient does not meet criteria for inpatient alcohol detox. I explained that there are resources in the community that can help them and I encouraged him to contact the treatment centers. I provided him with a resource guide. I encouraged him to pick up his blood pressure medication and Librium that could help with his alcohol withdrawal. I discussed return precautions. I advised the patient to follow-up with their primary care provider this week. I advised the patient to return to the emergency department with new or worsening symptoms or new concerns. The patient verbalized understanding and agreement with plan.       Final Clinical Impressions(s) / ED Diagnoses   Final diagnoses:  Alcohol abuse    New Prescriptions New Prescriptions   No medications on file     Everlene FarrierWilliam Aliah Eriksson, PA-C 08/02/16 1646    Tilden FossaElizabeth Rees, MD 08/03/16 [phone]

## 2016-08-02 NOTE — ED Notes (Signed)
PA at bedside.

## 2016-08-02 NOTE — Discharge Instructions (Signed)
Please pick up your prescriptions like we discussed. See attached resource guide   Substance Abuse Treatment Programs  Intensive Outpatient Programs [facility]     [address]      [place], [place]                   [phone]       [facility] [address] [place], [place] [phone]  [facility]     (Inpatient and outpatient)     [address]           [phone]    [facility] [phone] (Suboxone and Methadone)  [address]      [place], [place] [address]      [phone]       [address] [place], [place] [phone]  [facility] (Outpatient/Inpatient, [facility])    (insurance only) [phone]             [facility] (Groups & Residential) [place], [place] [phone]     [facility]     [address]     [place], [place]      [phone]       [facility] (for caregivers and family) [phone] [address] [place], [place] [phone]      Residential Treatment Programs [facility]      [address], [place], [place] [address]  [phone]       [facility] [address] [place], [place] [address] [phone]  [facility]        [phone]       [facility] [phone]  [facility]             [address]                                         [place], [place]                                                [phone] or [phone]                               [facility] [place] [address] [place], [address] [phone]  [facility]    [address]      [place], [place]     [phone]       [facility] [address] [place], [place] [phone]  [facility]   [address]     [place], [place] [address]     [phone]      Admissions: 8am-3pm  M-F  [facility] ([facility]) [address] [place], [place] [phone]  [facility]: Residential Program [phone](90 Days)   [place], [place]      [phone] or [phone]     [facility]: [facility] [place], [place] (Walk in Hours over the weekend or by referral)  [facility] [address] [place], [place], [place] [address] [phone]  Crisis Mobile: [facility]:  [phone] (for crisis response 24 hours a day) [facility] Hotline:  [phone] Outpatient Psychiatry and Counseling  [facility]: Mobile Crisis Management 24 hours:  [phone]  [facility] [employer] sliding scale fee and walk in schedule: M-F 8am-12pm/1pm-3pm [place], [place] [address] [address] [place], [place] [address] [phone]  [facility] ([facility])- new patient walk-in appointments available Monday - Friday 8am -3pm.          [address] [place], [place] [address] [phone] or crisis line- [facility]/ Intensive Outpatient Therapy Program [place], [place] [address] [place]      [phone] [address]. [place], [place] [address]                 [place]   [facility] [phone]. [address] [place], [place] [address]   [employer] [facility]          [address] Johnette Abraham  [place], [place] [address]       [phone]  [facility] [address], [place], [place] [address] [phone]  [facility]    [address] [place], [place] [address]     [place], [place] Joycelyn Man     [place]  [address]     [phone]       [facility] [place] [address]  [facility]     [address], [place], [place], [place] [address] [place]     [address]     [place], [place] [address]     [phone]       [facility] [address] [place], [place] [address] [phone] Resources for Temporary Residential Assistance/Crisis [place] [facility]) M-F 8am-3pm   [address], [place] [address]   [phone] Services include: laundry, barbering, support groups, case management, phone  & computer access, showers, AA/NA mtgs, mental health/substance abuse nurse, job skills class, disability information, VA assistance, spiritual classes, etc.   [facility]   [address], [place]     [place]              [employer] (women and children)       [place]. [place], [place] [address] [phone] [email or web address] for application and process Application Required  [facility]   [address]    [place] [address]     [phone]                    [place], [place] [address] [id number] application appt.) Application Required  [employer] (women only)    [place], [place]  [place]      Intake starts 6pm daily Need valid ID, SSC, & Police report [employer] [address] [place], [place] [id number] Application Required  [employer] (men only)     [place]      [place], [place]       [place] (Pregnant  women only) [address] [place], [place]  [facility]      [address]      [place], [place] [address]     [phone]             [facility] [address] [place], [place] 90 day commitment/SA/Application process  [facility](men only)     [address]     [place], [place]       Check-in at [facility] [address] [place], [place] [address] [phone] Men/Women/Women and Children must be there by 7 pm  [place], [place]

## 2018-07-23 ENCOUNTER — Ambulatory Visit (HOSPITAL_COMMUNITY)
Admission: EM | Admit: 2018-07-23 | Discharge: 2018-07-23 | Disposition: A | Discharge status: Home / Self Care | Payer: BLUE CROSS/BLUE SHIELD | Attending: Family Medicine | Admitting: Family Medicine

## 2018-07-23 ENCOUNTER — Encounter (HOSPITAL_COMMUNITY): Payer: Self-pay

## 2018-07-23 DIAGNOSIS — R220 Localized swelling, mass and lump, head: Secondary | ICD-10-CM | POA: Insufficient documentation

## 2018-07-23 DIAGNOSIS — H00014 Hordeolum externum left upper eyelid: Secondary | ICD-10-CM | POA: Insufficient documentation

## 2018-07-23 MED ORDER — KETOROLAC TROMETHAMINE 60 MG/2ML IM SOLN
INTRAMUSCULAR | Status: AC
  Filled 2018-07-23: qty 2

## 2018-07-23 MED ORDER — AMOXICILLIN-POT CLAVULANATE 875-125 MG PO TABS: 1.0000 | ORAL_TABLET | Freq: Two times a day (BID) | ORAL | 0 refills | Status: DC

## 2018-07-23 MED ORDER — KETOROLAC TROMETHAMINE 60 MG/2ML IM SOLN
60.0000 mg | Freq: Once | INTRAMUSCULAR | Status: AC
  Administered 2018-07-23: 60 mg via INTRAMUSCULAR

## 2018-07-23 NOTE — Discharge Instructions (Signed)
You can an infected stye that is causing swelling into your eye and face.  We will treat this with Augmentin Toradol injection given here for pain .  Warm compresses at home to promote drainage.  Follow up as needed for continued or worsening symptoms

## 2018-07-23 NOTE — ED Triage Notes (Signed)
Pt present severe headache, swelling of his eyes and faces. Symptoms started over a  Week.

## 2018-07-24 NOTE — ED Provider Notes (Signed)
[facility]    CSN: [id number] Arrival date & time: 07/23/18  1012     History   Chief Complaint Chief Complaint  Patient presents with  . Migraine  . Facial Swelling    Both Eyes     HPI Eugene Franco is a 43 y.o. male.   Patient is a 43 year old male with past medical history of hypertension.  He reports approximately 1 week of worsening left eye pain, lid swelling.  The pain is worse with opening and closing the eye.  He does have a history of chronic styes.  He denies any associated vision problems or fevers.  He denies any injury to the eye or foreign body. He denies any drainage or itching from the eye.   ROS per HPI      Past Medical History:  Diagnosis Date  . Arthritis   . GERD (gastroesophageal reflux disease)   . Hypertension     Patient Active Problem List   Diagnosis Date Noted  . Thrombosed external hemorrhoid 02/10/2013  . Hypertension 12/30/2010  . Alcohol ingestion of one to four drinks per day 12/30/2010  . Tobacco use disorder 12/30/2010  . Back pain 12/30/2010  . Annual physical exam 12/02/2010    History reviewed. No pertinent surgical history.     Home Medications    Prior to Admission medications   Medication Sig Start Date End Date Taking? Authorizing Provider  amLODipine (NORVASC) 5 MG tablet Take 1 tablet (5 mg total) by mouth daily. Patient not taking: Reported on 08/02/2016 07/30/16   Garlon Hatchet, PA-C  amoxicillin-clavulanate (AUGMENTIN) 875-125 MG tablet Take 1 tablet by mouth every 12 (twelve) hours. 07/23/18   Dahlia Byes A, NP  chlordiazePOXIDE (LIBRIUM) 25 MG capsule 50mg  PO TID x 1D, then 25-50mg  PO BID X 1D, then 25-50mg  PO QD X 1D Patient not taking: Reported on 08/02/2016 07/30/16   Garlon Hatchet, PA-C  docusate sodium (COLACE) 100 MG capsule Take 1 capsule (100 mg total) by mouth every 12 (twelve) hours. Patient not taking: Reported on 05/18/2015 02/08/13   Rolland Porter, MD  ondansetron (ZOFRAN ODT) 4 MG  disintegrating tablet Take 1 tablet (4 mg total) by mouth every 8 (eight) hours as needed for nausea. Patient not taking: Reported on 08/02/2016 07/30/16   Garlon Hatchet, PA-C  oxyCODONE-acetaminophen (PERCOCET/ROXICET) 5-325 MG per tablet Take 1 tablet by mouth every 4 (four) hours as needed for pain. Patient not taking: Reported on 05/18/2015 02/10/13   Manus Rudd, MD    Family History Family History  Problem Relation Age of Onset  . Arthritis Mother   . Arthritis Father   . Cancer Sister        breast  . Cancer Sister   . Arthritis Maternal Grandmother   . Hypertension Maternal Grandmother   . Arthritis Maternal Grandfather   . Hypertension Maternal Grandfather     Social History Social History   Tobacco Use  . Smoking status: Current Every Day Smoker    Packs/day: 0.25    Types: Cigarettes  . Smokeless tobacco: Never Used  Substance Use Topics  . Alcohol use: Yes  . Drug use: Yes    Types: Marijuana     Allergies   Patient has no known allergies.   Review of Systems Review of Systems   Physical Exam Triage Vital Signs ED Triage Vitals [07/23/18 1111]  Enc Vitals Group     BP (!) 157/105     Pulse Rate 84  Resp 16     Temp 98.4 F (36.9 C)     Temp Source Oral     SpO2 99 %     Weight      Height      Head Circumference      Peak Flow      Pain Score 10     Pain Loc      Pain Edu?      Excl. in GC?    No data found.  Updated Vital Signs BP (!) 157/105 (BP Location: Right Arm)   Pulse 84   Temp 98.4 F (36.9 C) (Oral)   Resp 16   SpO2 99%   Visual Acuity Right Eye Distance:   Left Eye Distance:   Bilateral Distance:    Right Eye Near:   Left Eye Near:    Bilateral Near:     Physical Exam Vitals signs and nursing note reviewed.  Constitutional:      Appearance: He is well-developed.  HENT:     Head: Normocephalic and atraumatic.     Nose: Nose normal.     Mouth/Throat:     Pharynx: Oropharynx is clear.  Eyes:      Extraocular Movements: Extraocular movements intact.     Pupils: Pupils are equal, round, and reactive to light.     Comments: Large internal stye to the left upper outer canthus.  No drainage Upper and lower lid swelling.  Very tender to touch. Very tender with opening and closing the eye. Nontender to globe Mild scleral injection  Neck:     Musculoskeletal: Neck supple.  Cardiovascular:     Rate and Rhythm: Normal rate and regular rhythm.     Heart sounds: No murmur.  Pulmonary:     Effort: Pulmonary effort is normal. No respiratory distress.     Breath sounds: Normal breath sounds.  Abdominal:     Palpations: Abdomen is soft.     Tenderness: There is no abdominal tenderness.  Skin:    General: Skin is warm and dry.  Neurological:     Mental Status: He is alert.  Psychiatric:        Mood and Affect: Mood normal.      UC Treatments / Results  Labs (all labs ordered are listed, but only abnormal results are displayed) Labs Reviewed - No data to display  EKG None  Radiology No results found.  Procedures Procedures (including critical care time)  Medications Ordered in UC Medications  ketorolac (TORADOL) injection 60 mg (60 mg Intramuscular Given 07/23/18 1200)    Initial Impression / Assessment and Plan / UC Course  I have reviewed the triage vital signs and the nursing notes.  Pertinent labs & imaging results that were available during my care of the patient were reviewed by me and considered in my medical decision making (see chart for details).     Stye with mild peri orbital cellulitis My concern is the amount of pain surrounding the eye. I think its appropriate to go ahead and treat with oral antibiotics at this time We will do Augmentin twice a day for 7 days Toradol injection given here in clinic Strict precautions that if the swelling continues and starts affecting his vision or ability to open the eye he needs to follow-up or go to the emergency  room as soon as possible Patient understanding and agree to plan Also instructed to do warm compresses to the eye to help promote drainage from the stye  Final Clinical Impressions(s) / UC Diagnoses   Final diagnoses:  Hordeolum externum of left upper eyelid  Facial swelling     Discharge Instructions     You can an infected stye that is causing swelling into your eye and face.  We will treat this with Augmentin Toradol injection given here for pain .  Warm compresses at home to promote drainage.  Follow up as needed for continued or worsening symptoms     ED Prescriptions    Medication Sig Dispense Auth. Provider   amoxicillin-clavulanate (AUGMENTIN) 875-125 MG tablet Take 1 tablet by mouth every 12 (twelve) hours. 14 tablet Janace ArisBast, Sumayah Bearse A, NP     Controlled Substance Prescriptions [place] Controlled Substance Registry consulted? no   Janace ArisBast, Carold Eisner A, NP 07/24/18 [phone]

## 2018-09-10 DIAGNOSIS — I10 Essential (primary) hypertension: Secondary | ICD-10-CM | POA: Diagnosis not present

## 2018-09-10 DIAGNOSIS — M25831 Other specified joint disorders, right wrist: Secondary | ICD-10-CM | POA: Diagnosis not present

## 2018-09-10 DIAGNOSIS — Z23 Encounter for immunization: Secondary | ICD-10-CM | POA: Diagnosis not present

## 2018-09-10 DIAGNOSIS — F419 Anxiety disorder, unspecified: Secondary | ICD-10-CM | POA: Diagnosis not present

## 2018-09-10 DIAGNOSIS — Z0000 Encounter for general adult medical examination without abnormal findings: Secondary | ICD-10-CM | POA: Diagnosis not present

## 2018-09-10 DIAGNOSIS — N529 Male erectile dysfunction, unspecified: Secondary | ICD-10-CM | POA: Diagnosis not present

## 2018-09-11 DIAGNOSIS — Z0000 Encounter for general adult medical examination without abnormal findings: Secondary | ICD-10-CM | POA: Diagnosis not present

## 2018-09-11 DIAGNOSIS — I10 Essential (primary) hypertension: Secondary | ICD-10-CM | POA: Diagnosis not present

## 2018-10-13 DIAGNOSIS — M25831 Other specified joint disorders, right wrist: Secondary | ICD-10-CM | POA: Diagnosis not present

## 2019-07-06 DIAGNOSIS — M6740 Ganglion, unspecified site: Secondary | ICD-10-CM | POA: Diagnosis not present

## 2019-07-08 DIAGNOSIS — M25831 Other specified joint disorders, right wrist: Secondary | ICD-10-CM | POA: Diagnosis not present

## 2019-07-08 DIAGNOSIS — M79642 Pain in left hand: Secondary | ICD-10-CM | POA: Diagnosis not present

## 2019-07-21 DIAGNOSIS — M19031 Primary osteoarthritis, right wrist: Secondary | ICD-10-CM | POA: Diagnosis not present

## 2019-08-05 DIAGNOSIS — M25831 Other specified joint disorders, right wrist: Secondary | ICD-10-CM | POA: Diagnosis not present

## 2019-09-02 DIAGNOSIS — M25532 Pain in left wrist: Secondary | ICD-10-CM | POA: Diagnosis not present

## 2019-09-02 DIAGNOSIS — M25831 Other specified joint disorders, right wrist: Secondary | ICD-10-CM | POA: Diagnosis not present

## 2019-09-02 DIAGNOSIS — M25631 Stiffness of right wrist, not elsewhere classified: Secondary | ICD-10-CM | POA: Diagnosis not present

## 2019-09-09 DIAGNOSIS — N390 Urinary tract infection, site not specified: Secondary | ICD-10-CM | POA: Diagnosis not present

## 2019-09-09 DIAGNOSIS — M25631 Stiffness of right wrist, not elsewhere classified: Secondary | ICD-10-CM | POA: Diagnosis not present

## 2019-09-09 DIAGNOSIS — R7301 Impaired fasting glucose: Secondary | ICD-10-CM | POA: Diagnosis not present

## 2019-09-09 DIAGNOSIS — I10 Essential (primary) hypertension: Secondary | ICD-10-CM | POA: Diagnosis not present

## 2019-09-13 DIAGNOSIS — Z0000 Encounter for general adult medical examination without abnormal findings: Secondary | ICD-10-CM | POA: Diagnosis not present

## 2019-09-13 DIAGNOSIS — E7800 Pure hypercholesterolemia, unspecified: Secondary | ICD-10-CM | POA: Diagnosis not present

## 2019-09-13 DIAGNOSIS — I10 Essential (primary) hypertension: Secondary | ICD-10-CM | POA: Diagnosis not present

## 2019-09-15 DIAGNOSIS — M25631 Stiffness of right wrist, not elsewhere classified: Secondary | ICD-10-CM | POA: Diagnosis not present

## 2019-09-23 DIAGNOSIS — M25831 Other specified joint disorders, right wrist: Secondary | ICD-10-CM | POA: Diagnosis not present

## 2019-09-23 DIAGNOSIS — M25631 Stiffness of right wrist, not elsewhere classified: Secondary | ICD-10-CM | POA: Diagnosis not present

## 2019-10-19 DIAGNOSIS — M25631 Stiffness of right wrist, not elsewhere classified: Secondary | ICD-10-CM | POA: Diagnosis not present

## 2019-10-28 DIAGNOSIS — M25631 Stiffness of right wrist, not elsewhere classified: Secondary | ICD-10-CM | POA: Diagnosis not present

## 2019-10-28 DIAGNOSIS — M79642 Pain in left hand: Secondary | ICD-10-CM | POA: Diagnosis not present

## 2019-10-28 DIAGNOSIS — M25831 Other specified joint disorders, right wrist: Secondary | ICD-10-CM | POA: Diagnosis not present

## 2019-11-25 DIAGNOSIS — M25631 Stiffness of right wrist, not elsewhere classified: Secondary | ICD-10-CM | POA: Diagnosis not present

## 2019-11-25 DIAGNOSIS — M25831 Other specified joint disorders, right wrist: Secondary | ICD-10-CM | POA: Diagnosis not present

## 2020-08-24 ENCOUNTER — Other Ambulatory Visit: Payer: Self-pay

## 2020-08-24 ENCOUNTER — Ambulatory Visit (INDEPENDENT_AMBULATORY_CARE_PROVIDER_SITE_OTHER): Payer: No Payment, Other | Admitting: Behavioral Health

## 2020-08-24 DIAGNOSIS — F1020 Alcohol dependence, uncomplicated: Secondary | ICD-10-CM

## 2020-08-24 NOTE — Progress Notes (Signed)
Comprehensive Clinical Assessment (CCA) Note  08/24/2020 Eugene Franco [id number]  Eugene Franco is a 45 year old male who presents to [facility] for a scheduled Comprehensive Clinical Assessment. Client is accompanied by his wife, Eugene Franco, who was present during the assessment. Client was referred to [facility] by [facility] as a hospital discharge follow-up after completing detox treatment. Client reports he is currently prescribed Naltrexone and Zoloft. Client reports not feeling as if his anti-depressant medication has been effective in treating his symptoms. Client stated he began taking these medications during his most recent detox treatment.   Client is currently unemployed; however, he reports having plans to seek full time employment. He reports having 3 children and currently lives with his wife, whom he has been married to for 10 years. During assessment, client would often look to his wife to respond to certain assessment questions. Pt reports he sought detox treatment because "I was at risk for losing my family."  Client reports having symptoms of anxiety; however, he denied all other mental health symptoms.  During evaluation client is alert/oriented x 4; calm/cooperative; and mood is appropriate and congruent with affect. Client does not appear to be responding to internal/external stimuli or delusional thoughts. Client denies suicidal/self-harm/homicidal ideation, psychosis, and paranoia. Client answered question appropriately.  Malnutrition Score: 0  Pain Score: 4  PHQ-2 Total Score: 0  C-SSRS Risk Category: No Risk   Treatment Recommendation(s): Medication Management and Substance Abuse Intensive Outpatient Program - Client was agreeable to attend SAIOP; however, he reports it could become a time conflict once he resumes full time employment.  Chief Complaint: No chief complaint on file.  Visit Diagnosis: Alcohol Use Disorder, Severe    CCA Screening, Triage and  Referral (STR)  Patient Reported Information How did you hear about [place]? No data recorded Referral name: No data recorded Referral phone number: No data recorded  Whom do you see for routine medical problems? No data recorded Practice/Facility Name: No data recorded Practice/Facility Phone Number: No data recorded Name of Contact: No data recorded Contact Number: No data recorded Contact Fax Number: No data recorded Prescriber Name: No data recorded Prescriber Address (if known): No data recorded  What Is the Reason for Your Visit/Call Today? No data recorded How Long Has This Been Causing You Problems? No data recorded What Do You Feel Would Help You the Most Today? No data recorded  Have You Recently Been in Any Inpatient Treatment (Hospital/Detox/Crisis Center/28-Day Program)? Yes  Name/Location of Program/Hospital:[facility]  How Long Were You There? No data recorded When Were You Discharged? No data recorded  Have You Ever Received Services From [facility] Before? No  Who Do You See at [facility]? No data recorded  Have You Recently Had Any Thoughts About Hurting Yourself? No  Are You Planning to Commit Suicide/Harm Yourself At This time? No   Have you Recently Had Thoughts About Hurting Someone Karolee Ohs? No  Explanation: No data recorded  Have You Used Any Alcohol or Drugs in the Past 24 Hours? No data recorded How Long Ago Did You Use Drugs or Alcohol? No data recorded What Did You Use and How Much? No data recorded  Do You Currently Have a Therapist/Psychiatrist? No data recorded Name of Therapist/Psychiatrist: No data recorded  Have You Been Recently Discharged From Any Office Practice or Programs? No data recorded Explanation of Discharge From Practice/Program: No data recorded    CCA Screening Triage Referral Assessment Type of Contact: No data recorded Is this Initial  or Reassessment? No data recorded Date Telepsych consult ordered in [facility]:  No  data recorded Time Telepsych consult ordered in [facility]:  No data recorded  Patient Reported Information Reviewed? No data recorded Patient Left Without Being Seen? No data recorded Reason for Not Completing Assessment: No data recorded  Collateral Involvement: No data recorded  Does Patient Have a Court Appointed Legal Guardian? No data recorded Name and Contact of Legal Guardian: No data recorded If Minor and Not Living with Parent(s), Who has Custody? No data recorded Is CPS involved or ever been involved? No data recorded Is APS involved or ever been involved? No data recorded  Patient Determined To Be At Risk for Harm To Self or Others Based on Review of Patient Reported Information or Presenting Complaint? No data recorded Method: No data recorded Availability of Means: No data recorded Intent: No data recorded Notification Required: No data recorded Additional Information for Danger to Others Potential: No data recorded Additional Comments for Danger to Others Potential: No data recorded Are There Guns or Other Weapons in Your Home? No data recorded Types of Guns/Weapons: No data recorded Are These Weapons Safely Secured?                            No data recorded Who Could Verify You Are Able To Have These Secured: No data recorded Do You Have any Outstanding Charges, Pending Court Dates, Parole/Probation? No data recorded Contacted To Inform of Risk of Harm To Self or Others: No data recorded  Location of Assessment: No data recorded  Does Patient Present under Involuntary Commitment? No data recorded IVC Papers Initial File Date: No data recorded  [place] data recorded  Patient Currently Receiving the Following Services: No data recorded  Determination of Need: No data recorded  Options For Referral: No data recorded    CCA Biopsychosocial Intake/Chief Complaint:  Clt presents today for a hospital d/c follow-up for SAIOP and medication management  services.  Current Symptoms/Problems: Alcohol Use   Patient Reported Schizophrenia/Schizoaffective Diagnosis in Past: No   Strengths: No data recorded Preferences: No data recorded Abilities: No data recorded  Type of Services Patient Feels are Needed: No data recorded  Initial Clinical Notes/Concerns: No data recorded  Mental Health Symptoms Depression:  None   Duration of Depressive symptoms: No data recorded  Mania:  None   Anxiety:   Worrying; Tension; Sleep; Irritability; Difficulty concentrating   Psychosis:  None   Duration of Psychotic symptoms: No data recorded  Trauma:  None   Obsessions:  None   Compulsions:  None   Inattention:  None   Hyperactivity/Impulsivity:  N/A   Oppositional/Defiant Behaviors:  None   Emotional Irregularity:  None   Other Mood/Personality Symptoms:  None    Mental Status Exam Appearance and self-care  Stature:  Average   Weight:  Average weight   Clothing:  Neat/clean   Grooming:  Normal   Cosmetic use:  None   Posture/gait:  Normal   Motor activity:  Not Remarkable   Sensorium  Attention:  Normal   Concentration:  Normal   Orientation:  X5   Recall/memory:  Normal   Affect and Mood  Affect:  Appropriate   Mood:  Other (Comment) (Pleasant)   Relating  Eye contact:  Normal   Facial expression:  Responsive   Attitude toward examiner:  Cooperative   Thought and Language  Speech flow: Clear and Coherent  Thought content:  Appropriate to Mood and Circumstances   Preoccupation:  None   Hallucinations:  None   Organization:  No data recorded  [employer] of Knowledge:  Average   Intelligence:  Average   Abstraction:  Normal   Judgement:  Normal   Reality Testing:  Adequate   Insight:  Fair   Decision Making:  Normal   Social Functioning  Social Maturity:  No data recorded  Social Judgement:  Normal   Stress  Stressors:  Family conflict; Work; Armed forces operational officer; Surveyor, quantity;  Relationship   Coping Ability:  Normal   Skill Deficits:  None   Supports:  Family     Religion: Religion/Spirituality Are You A Religious Person?: Yes What is Your Religious Affiliation?: Baptist How Might This Affect Treatment?: None  Leisure/Recreation: Leisure / Recreation Do You Have Hobbies?: Yes Leisure and Hobbies: Cooking, DIY projects  Exercise/Diet: Exercise/Diet Do You Exercise?: Yes What Type of Exercise Do You Do?: Run/Walk How Many Times a Week Do You Exercise?: 1-3 times a week Have You Gained or Lost A Significant Amount of Weight in the Past Six Months?: No Do You Follow a Special Diet?: No Do You Have Any Trouble Sleeping?: Yes Explanation of Sleeping Difficulties: Difficulties getting to sleep and staying asleep   CCA Employment/Education Employment/Work Situation: Employment / Work Situation Employment situation: Unemployed What is the longest time patient has a held a job?: 5 years Where was the patient employed at that time?: [employer] Has patient ever been in the [employer]?: No  Education: Education Is Patient Currently Attending School?: No Last Grade Completed: 12 Did Garment/textile technologist From [employer]?: Yes ([employer]) Did Theme park manager?: Yes Did You Attend Graduate School?: No Did You Have An Individualized Education Program (IIEP): No Did You Have Any Difficulty At School?: No Patient's Education Has Been Impacted by Current Illness: No   CCA Family/Childhood History Family and Relationship History: Family history Marital status: Married Number of Years Married: 10 What types of issues is patient dealing with in the relationship?: None Additional relationship information: None Are you sexually active?: Yes What is your sexual orientation?: Hereosexual Does patient have children?: Yes How many children?: 3 How is patient's relationship with their children?: 26yo, 20yo, 10 - "them my boys"  Childhood History:   Childhood History By whom was/is the patient raised?: Both parents Additional childhood history information: N/A Description of patient's relationship with caregiver when they were a child: "Normal ... it was good .. they provided for me." Patient's description of current relationship with people who raised him/her: "It's still the same .. father passed 2005 (80yo) due to natural causes" How were you disciplined when you got in trouble as a child/adolescent?: "spankings, punishments" Does patient have siblings?: Yes Number of Siblings: 9 Description of patient's current relationship with siblings: "One bing happy family" Did patient suffer any verbal/emotional/physical/sexual abuse as a child?: No Did patient suffer from severe childhood neglect?: No Has patient ever been sexually abused/assaulted/raped as an adolescent or adult?: No Was the patient ever a victim of a crime or a disaster?: No Witnessed domestic violence?: No Has patient been affected by domestic violence as an adult?: No  Child/Adolescent Assessment: N/A     CCA Substance Use Alcohol/Drug Use: Alcohol / Drug Use Pain Medications: See MAR; Oxycodone ([facility] - still prescribes medications) Prescriptions: See MAR Over the Counter: See MAR History of alcohol / drug use?: Yes Longest period of sobriety (when/how long): 1  year .... incarcerated in 2002 Negative Consequences of Use: Personal relationships,Work / School,Legal,Financial Withdrawal Symptoms: Agitation,Sweats,Nausea / Vomiting,Irritability,Change in blood pressure Substance #1 Name of Substance 1: Alcohol 1 - Age of First Use: 45yo 1 - Amount (size/oz): "a pint and 6 pack a day" 1 - Frequency: Daily 1 - Duration: Several years 1 - Last Use / Amount: 08/15/2020 1 - Method of Aquiring: work 1- Route of Use: Drinking     ASAM's:  Six Dimensions of Multidimensional Assessment  Dimension 1:  Acute Intoxication and/or Withdrawal Potential:    Dimension 1:  Description of individual's past and current experiences of substance use and withdrawal: Agitation, Iritability, "Night sweats", Vomiting, Change in Blood Pressure  Dimension 2:  Biomedical Conditions and Complications:   Dimension 2:  Description of patient's biomedical conditions and  complications: HBP; broken wrist, back pain  Dimension 3:  Emotional, Behavioral, or Cognitive Conditions and Complications:  Dimension 3:  Description of emotional, behavioral, or cognitive conditions and complications: Depression due to unemployment and strained family relationships  Dimension 4:  Readiness to Change:  Dimension 4:  Description of Readiness to Change criteria: Action  Dimension 5:  Relapse, Continued use, or Continued Problem Potential:  Dimension 5:  Relapse, continued use, or continued problem potential critiera description: High risk for relapse  Dimension 6:  Recovery/Living Environment:  Dimension 6:  Recovery/Iiving environment criteria description: Supportive living environment  ASAM Severity Score: ASAM's Severity Rating Score: 10  ASAM Recommended Level of Treatment: ASAM Recommended Level of Treatment: Level II Intensive Outpatient Treatment   Substance use Disorder (SUD) Substance Use Disorder (SUD)  Checklist Symptoms of Substance Use: Continued use despite having a persistent/recurrent physical/psychological problem caused/exacerbated by use,Continued use despite persistent or recurrent social, interpersonal problems, caused or exacerbated by use,Evidence of tolerance,Large amounts of time spent to obtain, use or recover from the substance(s),Persistent desire or unsuccessful efforts to cut down or control use,Presence of craving or strong urge to use,Recurrent use that results in a failure to fulfill major role obligations (work, school, home),Repeated use in physically hazardous situations,Social, occupational, recreational activities given up or reduced due to  use,Substance(s) often taken in larger amounts or over longer times than was intended  Recommendations for Services/Supports/Treatments: Recommendations for Services/Supports/Treatments Recommendations For Services/Supports/Treatments: Medication Management,SAIOP (Substance Abuse Intensive Outpatient Program)  DSM5 Diagnoses: Patient Active Problem List   Diagnosis Date Noted  . Thrombosed external hemorrhoid 02/10/2013  . Hypertension 12/30/2010  . Alcohol ingestion of one to four drinks per day 12/30/2010  . Tobacco use disorder 12/30/2010  . Back pain 12/30/2010  . Annual physical exam 12/02/2010    Patient Centered Plan: Patient is on the following Treatment Plan(s):  Substance Abuse   Mamie Nick, Counselor

## 2020-08-28 ENCOUNTER — Encounter (HOSPITAL_COMMUNITY): Payer: Self-pay | Admitting: Behavioral Health

## 2020-08-28 ENCOUNTER — Ambulatory Visit (INDEPENDENT_AMBULATORY_CARE_PROVIDER_SITE_OTHER): Payer: No Payment, Other | Admitting: Behavioral Health

## 2020-08-28 ENCOUNTER — Other Ambulatory Visit: Payer: Self-pay

## 2020-08-28 DIAGNOSIS — F1220 Cannabis dependence, uncomplicated: Secondary | ICD-10-CM

## 2020-08-28 DIAGNOSIS — M19131 Post-traumatic osteoarthritis, right wrist: Secondary | ICD-10-CM

## 2020-08-28 DIAGNOSIS — F1020 Alcohol dependence, uncomplicated: Secondary | ICD-10-CM

## 2020-08-28 DIAGNOSIS — F17200 Nicotine dependence, unspecified, uncomplicated: Secondary | ICD-10-CM | POA: Diagnosis not present

## 2020-08-28 DIAGNOSIS — M19132 Post-traumatic osteoarthritis, left wrist: Secondary | ICD-10-CM

## 2020-08-28 DIAGNOSIS — G894 Chronic pain syndrome: Secondary | ICD-10-CM

## 2020-08-28 DIAGNOSIS — I10 Essential (primary) hypertension: Secondary | ICD-10-CM

## 2020-08-28 MED ORDER — ESCITALOPRAM OXALATE 10 MG PO TABS: 10.0000 mg | ORAL_TABLET | Freq: Every day | ORAL | 0 refills | Status: DC

## 2020-08-28 MED ORDER — TRAMADOL HCL ER 100 MG PO TB24: 100.0000 mg | ORAL_TABLET | Freq: Every day | ORAL | 0 refills | Status: DC | PRN

## 2020-08-28 NOTE — Group Note (Addendum)
Group Topic: Identifying Triggers  Group Date: 08/28/2020 Start Time:  9:00 AM End Time: 12:00 PM Facilitators: Mamie Nick, Counselor  Department: [facility]  Number of Participants: 2  Group Focus: acceptance, check in, co-dependency, coping skills, depression, family, feeling awareness/expression, forgiveness, loss/grief issues, and relapse prevention Treatment Modality:  Cognitive Behavioral Therapy and Solution-Focused Therapy Interventions utilized were clarification, confrontation, exploration, and support Purpose: enhance coping skills, explore maladaptive thinking, express feelings, increase insight, regain self-worth, relapse prevention strategies, and trigger / craving management   Name: Eugene Franco Date of Birth: 05/07/1976  MR: [id number]    Level of Participation: active Quality of Participation: attentive and cooperative Interactions with others: gave feedback Mood/Affect: appropriate Triggers (if applicable): N/A Cognition: coherent/clear Progress: Minimal Response: Today is client's first day attending group. He reports having some apprehensions about group prior to attending today. Clt shared that his father died in 11/02/03 and his brother died 07/03/20. Clt processed this time in his life and realized that he used alcohol to cope during his grieving. Clt talked about his triggers and questioned if the empty bottles he has in his home can potentially be a trigger for him. Clt shares his peaks and valleys this week as - Peaks: "I cleaned the refrigerator, cooked for my family, and washed my families clothes" ... Valleys: "not going out and feeling secluded". Clt engaged well in group discussion while receptive to feedback offered by peers. Plan: follow-up needed  Patients Problems:  Patient Active Problem List   Diagnosis Date Noted  . Thrombosed external hemorrhoid 02/10/2013  . Hypertension 12/30/2010  . Alcohol ingestion of one  to four drinks per day 12/30/2010  . Tobacco use disorder 12/30/2010  . Back pain 12/30/2010  . Annual physical exam 12/02/2010     Family Program: Family present? No   Name of family member(s): N/A  UDS collected: No Results: N/A  AA/NA attended?: No  Sponsor?: NA

## 2020-08-28 NOTE — Progress Notes (Signed)
Psychiatric Initial Adult Assessment   Patient Identification: Eugene Franco MRN:  [id number] Date of Evaluation:  08/28/2020 Referral Source: [facility] Chief Complaint:   Visit Diagnosis: No diagnosis found.  History of Present Illness:08/24/2020 Client was referred to [facility] by [facility] as a hospital discharge follow-up after completing detox treatment Feb 15-19,2022. On 08/24/2020 pt met with Counselor: Ebrima Ranta is a 45 year old male who presents to [facility] for a scheduled Comprehensive Clinical Assessment. Client is accompanied by his wife, Eugene Franco, who was present during the assessment. Client was referred to [facility] by [facility] as a hospital discharge follow-up after completing detox treatment. Client reports he is currently prescribed Naltrexone and Zoloft. Client reports not feeling as if his anti-depressant medication has been effective in treating his symptoms. Client stated he began taking these medications during his most recent detox treatment.  Pt reports he sought detox treatment because "I was at risk for losing my family." Client reports having symptoms of anxiety; however, he denied all other mental health symptoms.  During evaluationclientis alert/oriented x 4; calm/cooperative; and mood is appropriate andcongruent with affect. Client does not appear to be responding to internal/external stimuli or delusional thoughts. Client denies suicidal/self-harm/homicidal ideation, psychosis, and paranoia. Client answered question appropriately.  In speaking with him today, he is motivated for treatment.He c/o pain in Lt wrist. He was on 1 Percocet daily from his Orthopedist for Lt Scapoholunate collapse awaiting surgical repair. He was placed on Naltrexone at discharge.He has no history of opiate abuse  He reports good response of his anxiety to Lexapro started at Hospital. He is having trouble sleeping. He does have cravings.  Associated  Signs/Symptoms: Substance use Disorder (SUD) Substance Use Disorder (SUD)  Checklist Symptoms of Substance Use: Continued use despite having a persistent/recurrent physical/psychological problem caused/exacerbated by use,Continued use despite persistent or recurrent social, interpersonal problems, caused or exacerbated by use,Evidence of tolerance,Large amounts of time spent to obtain, use or recover from the substance(s),Persistent desire or unsuccessful efforts to cut down or control use,Presence of craving or strong urge to use,Recurrent use that results in a failure to fulfill major role obligations (work, school, home),Repeated use in physically hazardous situations,Social, occupational, recreational activities given up or reduced due to use,Substance(s) often taken in larger amounts or over longer times than was intended  ASAM's:  Six Dimensions of Multidimensional Assessment Dimension 1:  Acute Intoxication and/or Withdrawal Potential:   Dimension 1:  Description of individual's past and current experiences of substance use and withdrawal: Agitation, Iritability, "Night sweats", Vomiting, Change in Blood Pressure  Dimension 2:  Biomedical Conditions and Complications:   Dimension 2:  Description of patient's biomedical conditions and  complications: HBP; broken wrist, back pain  Dimension 3:  Emotional, Behavioral, or Cognitive Conditions and Complications:  Dimension 3:  Description of emotional, behavioral, or cognitive conditions and complications: Depression due to unemployment and strained family relationships  Dimension 4:  Readiness to Change:  Dimension 4:  Description of Readiness to Change criteria: Action  Dimension 5:  Relapse, Continued use, or Continued Problem Potential:  Dimension 5:  Relapse, continued use, or continued problem potential critiera description: High risk for relapse  Dimension 6:  Recovery/Living Environment:  Dimension 6:  Recovery/Iiving environment criteria  description: Supportive living environment  ASAM Severity Score: ASAM's Severity Rating Score: 10  ASAM Recommended Level of Treatment: ASAM Recommended Level of Treatment: Level II Intensive Outpatient Treatment     Depression Symptoms:  PHQ 2 score 0 (Hypo) Manic  Symptoms:  None Anxiety Symptoms:  Worrying; Tension; Sleep; Irritability; Difficulty concentrating Psychotic Symptoms:  NONE PTSD Symptoms: Negative  Past Psychiatric History:  No previous rx until detox [facility]  2/16-2/24/2022  Previous Psychotropic Medications: Yes   Substance Abuse History in the last 12 months:   Name of Substance 1: Alcohol 1 - Age of First Use: 45yo 1 - Amount (size/oz): "a pint of vodka and 6 pack a day" 1 - Frequency: Daily 1 - Duration: Several years 1 - Last Use / Amount: 08/15/2020 1 - Method of Aquiring: work 1- Route of Use: Drinking Name of Substance 2:THC 1-  Medical Consequences: Hypertension not controlled Legal Consequences: None reported Family Consequences: Pt reports he sought detox treatment because "I was at risk for losing my family." Blackouts: + DT's: No Withdrawal Symptoms:   Agitation,Sweats,Nausea / Vomiting,Irritability,Change in blood pressure  Past Medical History:  Past Medical History:  Diagnosis Date  . Arthritis   . GERD (gastroesophageal reflux disease)   . * Hypertension Thrombosed external hemorrhoid 02/10/2013      Surgical History Repair of Rt wrist [place] 2021  Family Psychiatric History:   Family History:  Family History  Problem Relation Age of Onset  . Arthritis Mother   . Arthritis Father   . Cancer Sister        breast  . Cancer Sister   . Arthritis Maternal Grandmother   . Hypertension Maternal Grandmother   . Arthritis Maternal Grandfather   . Hypertension Maternal Grandfather     Social History:   Social History   Socioeconomic History  . Marital status: Married    Spouse name: Corporate treasurer  . Number of children: 3  .  Years of education: HS  . Highest education level: 12  Occupational History  . Not on file  Tobacco Use  . Smoking status: Current Every Day Smoker    Packs/day: 0.5    Types: Cigarettes  . Smokeless tobacco: Never Used  Substance and Sexual Activity  . Alcohol use: Yes  . Drug use: Yes    Types: Marijuana  . Sexual activity: Not on file  Other Topics Concern  . Not on file  Social History Narrative  . Not on file   Social Determinants of Health   Financial Resource Strain: See CCA  Food Insecurity: No  Transportation Needs: No  Physical Activity: Do You Exercise?: Yes What Type of Exercise Do You Do?: Run/Walk How Many Times a Week Do You Exercise?: 1-3 times a week  Stress:  Family conflict; Work; Scientist, research (physical sciences); Museum/gallery curator; Relationship  Social Connections: How is patient's relationship with their children?: 26yo, 20yo, 10 - "them my boys" Does patient have siblings?: Yes Number of Siblings: 9 Description of patient's current relationship with siblings: "One bing happy family    Additional Social History: Pt is currently unemployed; however, he reports having plans to seek full time employment. He reports having 3 children and currently lives with his wife, whom he has been married to for 10 years. During assessment, client would often look to his wife to respond to certain assessment questions.  Allergies:  No Known Allergies  Metabolic Disorder Labs: No results found for: HGBA1C, MPG No results found for: PROLACTIN Lab Results  Component Value Date   CHOL 205 (H) 11/22/2010   TRIG 95.0 11/22/2010   HDL 67.10 11/22/2010   CHOLHDL 3 11/22/2010   VLDL 19.0 11/22/2010   No results found for: TSH  Therapeutic Level Labs:NA  Current Medications: Current Outpatient  Medications  Medication Sig Dispense Refill  . amLODipine (NORVASC) 5 MG tablet Take 1 tablet (5 mg total) by mouth daily. (Patient not taking: Reported on 08/02/2016) 30 tablet 0  . amoxicillin-clavulanate  (AUGMENTIN) 875-125 MG tablet Take 1 tablet by mouth every 12 (twelve) hours. 14 tablet 0  . chlordiazePOXIDE (LIBRIUM) 25 MG capsule 44m PO TID x 1D, then 25-574mPO BID X 1D, then 25-5053mO QD X 1D (Patient not taking: Reported on 08/02/2016) 10 capsule 0  . docusate sodium (COLACE) 100 MG capsule Take 1 capsule (100 mg total) by mouth every 12 (twelve) hours. (Patient not taking: Reported on 05/18/2015) 60 capsule 0  . ondansetron (ZOFRAN ODT) 4 MG disintegrating tablet Take 1 tablet (4 mg total) by mouth every 8 (eight) hours as needed for nausea. (Patient not taking: Reported on 08/02/2016) 10 tablet 0  . oxyCODONE-acetaminophen (PERCOCET/ROXICET) 5-325 MG per tablet Take 1 tablet by mouth every 4 (four) hours as needed for pain. (Patient not taking: Reported on 05/18/2015) 40 tablet 0   No current facility-administered medications for this visit.    Musculoskeletal: Strength & Muscle Tone: within normal limits Gait & Station: normal Patient leans: N/A  Psychiatric Specialty Exam: Review of Systems  Constitutional: Positive for activity change (attempting abstinence). Negative for appetite change, chills, diaphoresis, fatigue, fever and unexpected weight change.  HENT: Negative for congestion, postnasal drip, rhinorrhea, sinus pressure, sinus pain, sneezing, sore throat, trouble swallowing and voice change.   Eyes: Negative for photophobia, pain, discharge, redness, itching and visual disturbance.  Respiratory: Negative.   Cardiovascular: Negative for chest pain, palpitations and leg swelling.       Hypertension  Gastrointestinal: Negative.   Endocrine: Negative for cold intolerance, heat intolerance, polydipsia, polyphagia and polyuria.  Genitourinary: Negative.   Musculoskeletal: Positive for arthralgias and joint swelling. Negative for back pain, gait problem, myalgias, neck pain and neck stiffness.       Scapulolunate collapse bilateral Rt repaired Lt chronic pain   Skin: Negative.    Allergic/Immunologic: Negative.   Neurological: Negative for tremors, seizures, syncope, facial asymmetry, speech difficulty, weakness, light-headedness, numbness and headaches.  Psychiatric/Behavioral: Positive for agitation (Cravings), behavioral problems (Alcohol dependence), dysphoric mood and sleep disturbance. Negative for confusion, decreased concentration, hallucinations, self-injury and suicidal ideas. The patient is nervous/anxious. The patient is not hyperactive.     There were no vitals taken for this visit.There is no height or weight on file to calculate BMI.  General Appearance: Casual and Well Groomed  Eye Contact:  Good  Speech:  Clear and Coherent and Normal Rate  Volume:  Normal  Mood:  Euthymic  Affect:  Congruent  Thought Process:  Coherent, Goal Directed and Descriptions of Associations: Intact  Orientation:  Full (Time, Place, and Person)  Thought Content:  WDL  Suicidal Thoughts:  No  Homicidal Thoughts:  No  Memory:  Negative  Judgement:  Impaired  Insight:  Lacking  Psychomotor Activity:  Normal  Concentration:  Concentration: Good and Attention Span: Good  Recall:  Negative  Fund of Knowledge:WDL  Language: Good  Akathisia:  NA  Handed:  Right  AIMS (if indicated):  NA  Assets:  Communication Skills Desire for Improvement Housing Resilience Social Support Transportation  ADL's:  Intact  Cognition: WNL  Sleep: : Difficulties getting to sleep and staying asleep    Screenings: [facility]  [facility]  Visit from 08/24/2020 in [place] No Risk     Urine Drug Screen 08/15/20 6:49 PM   Ref Range & Units 13 d ago Comments  Amphetamines Negative, Indeterminate Negative  Amphetamines Cut-Off = 1000 ng/mL  Barbiturates Negative, Indeterminate Negative  Barbiturates Cut-Off = 200  ng/mL  Benzodiazepines Negative, Indeterminate Negative  Benzodiazepines Cut-Off = 200 ng/mL  Cocaine Negative, Indeterminate Negative  Cocaine Cut-Off = 300 ng/mL  Opiates Negative, Indeterminate Negative  Opiates Cut-Off = 300 ng/mL  Oxycodone Negative, Indeterminate Negative  Oxycodone Cut-Off = 100 ng/mL   THC Negative, Indeterminate PositiveAbnormal  THC (Cannabinoids) Cut-Off = 50 ng/mL  Methadone Negative, Indeterminate Negative  Methadone Cut-Off = 121 ng/mL  Tricyclic antidepressants Negative, Indeterminate Negative  Tricyclic Antidepressants Cut-Off = 300 ng/ml  U Creatinine Concentrate MG/DL 134  No reference range determined  Resulting Agency  [facility]     Assessment: Alcohol dependence severe-1st treatment experience  and Plan: Treatment Plan/Recommendations:  Plan of Care:[facility] IOP see Counselor's individualized treatment plan  Laboratory:  UDS per protocol  Psychotherapy: IOP Group/Individual  Medications: MAT Baclofen  Routine PRN Medications:  Negative  Consultations: No  Safety Concerns: RISK ASSESSMENT -Negative  Other:  NA     Darlyne Russian, PA-C 2/28/20229:40 AM

## 2020-08-29 ENCOUNTER — Telehealth (HOSPITAL_COMMUNITY): Payer: Self-pay | Admitting: *Deleted

## 2020-08-29 NOTE — Telephone Encounter (Signed)
Call from [facility], patients preferred pharmacy wanting to make sure the provider, Maryjean Morn is aware patient is getting Naltrexone as he sent a rx in for Tramadol, which is controlled. Pharmacist is asking for verification the provider is aware of this before she will fill it. Will reach out to [facility] for him to verify he is OK with both, Naltrexone Rx was written by Dr Lavell Luster who does not work for this agency.

## 2020-08-30 ENCOUNTER — Encounter (HOSPITAL_COMMUNITY): Payer: Self-pay | Admitting: Behavioral Health

## 2020-08-30 ENCOUNTER — Other Ambulatory Visit (HOSPITAL_COMMUNITY): Payer: Self-pay | Admitting: Medical

## 2020-08-30 ENCOUNTER — Other Ambulatory Visit: Payer: Self-pay

## 2020-08-30 ENCOUNTER — Ambulatory Visit (INDEPENDENT_AMBULATORY_CARE_PROVIDER_SITE_OTHER): Payer: No Payment, Other | Admitting: Behavioral Health

## 2020-08-30 DIAGNOSIS — F17200 Nicotine dependence, unspecified, uncomplicated: Secondary | ICD-10-CM

## 2020-08-30 DIAGNOSIS — F1020 Alcohol dependence, uncomplicated: Secondary | ICD-10-CM

## 2020-08-30 DIAGNOSIS — I10 Essential (primary) hypertension: Secondary | ICD-10-CM

## 2020-08-30 DIAGNOSIS — G894 Chronic pain syndrome: Secondary | ICD-10-CM

## 2020-08-30 DIAGNOSIS — M19131 Post-traumatic osteoarthritis, right wrist: Secondary | ICD-10-CM

## 2020-08-30 DIAGNOSIS — F1220 Cannabis dependence, uncomplicated: Secondary | ICD-10-CM

## 2020-08-30 DIAGNOSIS — M19132 Post-traumatic osteoarthritis, left wrist: Secondary | ICD-10-CM

## 2020-08-30 MED ORDER — BACLOFEN 10 MG PO TABS: 10.0000 mg | ORAL_TABLET | Freq: Three times a day (TID) | ORAL | 1 refills | Status: DC

## 2020-08-30 MED ORDER — BACLOFEN 10 MG PO TABS: 10.0000 mg | ORAL_TABLET | Freq: Three times a day (TID) | ORAL | 1 refills | Status: AC

## 2020-08-30 NOTE — Telephone Encounter (Signed)
Sent chat note-issue addressed

## 2020-08-30 NOTE — Group Note (Signed)
Group Topic: Grief and Loss  Group Date: 08/30/2020 Start Time:  9:00 AM End Time: 11:00 AM Facilitators: Mamie Nick, Counselor  Department: [facility]  Number of Participants: 1  Group Focus: abuse issues, acceptance, anxiety, check in, coping skills, depression, family, feeling awareness/expression, forgiveness, and relapse prevention Treatment Modality:  Cognitive Behavioral Therapy and Solution-Focused Therapy Interventions utilized were clarification, confrontation, and exploration Purpose: enhance coping skills, explore maladaptive thinking, express feelings, increase insight, regain self-worth, relapse prevention strategies, and trigger / craving management   Name: Eugene Franco Date of Birth: [birth year]  MR: [id number]    Level of Participation: active Quality of Participation: attentive, cooperative, initiates communication, motivated and offered feedback Interactions with others: gave feedback Mood/Affect: appropriate Triggers (if applicable): N/A Cognition: coherent/clear Progress: Gaining insight Response: Clt checked-in by sharing that he rates his depression at a 0 and his anxiety at a 3, on a scale 0 to 10 with 10 being the worse. 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 Clt identified his current valleys as "going pass that liquor store". Clt talked about his experienced during his detox treatment and he states he came to the realization "I have a lot to lose". He process the cost and benefits to his past alcohol use. He shared that he is trying to use the following healthy coping skills: "pray, work, find something to do, go outside and play with my son". Clt provided insightful verbal feedback.  Plan: follow-up needed  Patients Problems:  Patient Active Problem List   Diagnosis Date Noted  . Thrombosed external  hemorrhoid 02/10/2013  . Hypertension 12/30/2010  . Alcohol ingestion of one to four drinks per day 12/30/2010  . Tobacco use disorder 12/30/2010  . Back pain 12/30/2010  . Annual physical exam 12/02/2010     Family Program: Family present? No   Name of family member(s): N/A  UDS collected: No Results: N/A  AA/NA attended?: No  Sponsor?: No

## 2020-08-30 NOTE — Progress Notes (Signed)
Patient ID: Eugene Franco, male   DOB: 1976-06-01, 45 y.o.   MRN: [id number] FU to complete medication based on Biology of addiction -instinctual brain triggers Responded  to pharmacy Chat re Rx Tramadol/naltrexone Reviewed PET Scans of addicted brains/dopamine deficient Reviewed Baclofen reduces craving video You Tube DC Naltrexone Start Baclofen Complete Initial

## 2020-09-01 ENCOUNTER — Other Ambulatory Visit: Payer: Self-pay

## 2020-09-01 ENCOUNTER — Ambulatory Visit (INDEPENDENT_AMBULATORY_CARE_PROVIDER_SITE_OTHER): Payer: No Payment, Other | Admitting: Behavioral Health

## 2020-09-01 DIAGNOSIS — F1020 Alcohol dependence, uncomplicated: Secondary | ICD-10-CM | POA: Diagnosis not present

## 2020-09-01 NOTE — Group Note (Signed)
Group Topic: Guilt/Shame  Group Date: 09/01/2020 Start Time:  9:00 AM End Time: 10:00 AM Facilitators: Mamie Nick, Counselor  Department: [facility]  Number of Participants: 1  Group Focus: acceptance, check in, communication, coping skills, family, feeling awareness/expression, forgiveness, healthy friendships, relapse prevention, and safety plan Treatment Modality:  Cognitive Behavioral Therapy and Solution-Focused Therapy Interventions utilized were clarification, confrontation, exploration, and story telling Purpose: enhance coping skills, explore maladaptive thinking, express feelings, express irrational fears, improve communication skills, increase insight, regain self-worth, reinforce self-care, relapse prevention strategies, and trigger / craving management   Name: Eugene Franco Date of Birth: [birth year]  MR: [id number]    Level of Participation: active Quality of Participation: attentive, cooperative, motivated and offered feedback Interactions with others: gave feedback Mood/Affect: appropriate Triggers (if applicable): N/A Cognition: coherent/clear, insightful and logical Progress: Gaining insight Response: Clt checked-in by sharing that he spent his night cooking for his family and this brought him joy. Clt reports his sleep to be "fair" and noted that he slept for 5 hours last night. He reported still having night sweats and clt questioned the meaning of these night sweats and disturbed sleep patterns. Clt shared with during group that he was embarrassed and ashamed fo the person he turns into when he drinks and becomes drunk. He states he love being able to be present for his family without the presence of alcohol in his system. Clt continues to gain insight while in his recovery process and has begun to acknowledge the costs of his past addictive behaviors. Clt also gained more insight into the MAT Baclofen by watching a short-film/research  study while in group today. Plan: follow-up needed  Patients Problems:  Patient Active Problem List   Diagnosis Date Noted  . Alcohol use disorder, severe, dependence (HCC) 08/30/2020  . Thrombosed external hemorrhoid 02/10/2013  . Hypertension 12/30/2010  . Alcohol ingestion of one to four drinks per day 12/30/2010  . Tobacco use disorder 12/30/2010  . Back pain 12/30/2010  . Annual physical exam 12/02/2010     Family Program: Family present? No   Name of family member(s): N/A  UDS collected: No Results: N/A  AA/NA attended?: No  Sponsor?: No

## 2020-09-04 ENCOUNTER — Other Ambulatory Visit: Payer: Self-pay

## 2020-09-04 ENCOUNTER — Ambulatory Visit (INDEPENDENT_AMBULATORY_CARE_PROVIDER_SITE_OTHER): Payer: No Payment, Other | Admitting: Behavioral Health

## 2020-09-04 DIAGNOSIS — F1020 Alcohol dependence, uncomplicated: Secondary | ICD-10-CM | POA: Diagnosis not present

## 2020-09-04 NOTE — Group Note (Signed)
Group Topic: Relapse Prevention  Group Date: 09/04/2020 Start Time:  9:00 AM End Time: 12:00 PM Facilitators: Mamie Nick, Counselor  Department: [facility]  Number of Participants: 3  Group Focus: acceptance, check in, communication, coping skills, depression, dual diagnosis, family, feeling awareness/expression, forgiveness, relapse prevention, and self-awareness Treatment Modality:  Cognitive Behavioral Therapy and Solution-Focused Therapy Interventions utilized were clarification, confrontation, exploration, and group exercise Purpose: enhance coping skills, explore maladaptive thinking, express feelings, express irrational fears, improve communication skills, increase insight, regain self-worth, reinforce self-care, relapse prevention strategies, and trigger / craving management   Name: Oracio Galen Date of Birth: March 27, 1976  MR: [id number]    Level of Participation: active Quality of Participation: attentive, cooperative, motivated, offered feedback and supportive Interactions with others: gave feedback Mood/Affect: appropriate Triggers (if applicable): N/A Cognition: coherent/clear, insightful and logical Progress: Minimal Response: Clt checked-in by sharing that he had a good weekend with his family. He talked about how proud he felt to watch his son's basketball game sober. He shared that he had an urge to walk to the nearby store and purchase a beer; however, he used thought-stopping and weighed the cost and benefits. He shared that he did not want to drink and his wife smell it on his breath when he returns home or be too intoxicated and miss his son's basketball game. Clt reports is has been using the support of his family to help him in his recovery efforts. Plan: follow-up needed  Patients Problems:  Patient Active Problem List   Diagnosis Date Noted  . Alcohol use disorder, severe, dependence (HCC) 08/30/2020  . Thrombosed external  hemorrhoid 02/10/2013  . Hypertension 12/30/2010  . Alcohol ingestion of one to four drinks per day 12/30/2010  . Tobacco use disorder 12/30/2010  . Back pain 12/30/2010  . Annual physical exam 12/02/2010     Family Program: Family present? No   Name of family member(s): N/A  UDS collected: No Results: N/A  AA/NA attended?: No  Sponsor?: No

## 2020-09-06 ENCOUNTER — Ambulatory Visit (INDEPENDENT_AMBULATORY_CARE_PROVIDER_SITE_OTHER): Payer: No Payment, Other | Admitting: Behavioral Health

## 2020-09-06 ENCOUNTER — Other Ambulatory Visit: Payer: Self-pay

## 2020-09-06 ENCOUNTER — Other Ambulatory Visit (HOSPITAL_COMMUNITY): Payer: Self-pay | Admitting: Medical

## 2020-09-06 DIAGNOSIS — F1020 Alcohol dependence, uncomplicated: Secondary | ICD-10-CM

## 2020-09-06 MED ORDER — TRAZODONE HCL 50 MG PO TABS: 50.0000 mg | ORAL_TABLET | Freq: Every evening | ORAL | 1 refills | Status: DC | PRN

## 2020-09-06 NOTE — Progress Notes (Signed)
Patient ID: Eugene Franco, male   DOB: [birth year], 45 y.o.   MRN: [id number] Rx for Trazodone sent.Has stopped Tramadol and refilled his Oxycodone from Ortho.Takes 1/day not abusing for chronic Lt wrist degenerative fracture. Reports he has not attended AA due to family duties.Says Counselor has spoken to him about this as well.

## 2020-09-06 NOTE — Group Note (Signed)
Group Topic: Feelings and Emotions  Group Date: 09/06/2020 Start Time:  9:00 AM End Time: 10:00 AM Facilitators: Mamie Nick, Counselor  Department: [facility]  Number of Participants: 1  Group Focus: acceptance, anxiety, check in, coping skills, depression, family, feeling awareness/expression, forgiveness, loss/grief issues, relapse prevention, and relaxation Treatment Modality:  Cognitive Behavioral Therapy Interventions utilized were clarification, confrontation, and exploration Purpose: enhance coping skills, explore maladaptive thinking, express feelings, express irrational fears, improve communication skills, increase insight, regain self-worth, reinforce self-care, relapse prevention strategies, and trigger / craving management   Name: Eugene Franco Date of Birth: [birth year]  MR: [id number]    Level of Participation: active Quality of Participation: attentive and cooperative Interactions with others: gave feedback Mood/Affect: appropriate Triggers (if applicable): N/A Cognition: coherent/clear, insightful and logical Progress: Gaining insight Response: Clt checked-in by sharing his peaks and valleys. Peak: "Cooking dinner for my family". Valley: Client noted that he did not have any valleys today. Client rated his depression at a 0 on a scale 0 to 10, with 10 being worse. Client rated his anxiety at a 1 on a scale 0 to 10, with 10 being worse. Clt talked about how much he missed his father and how his father's death has effected his moods in the past. Clt shared that he often did not talk about his emotions and how much he misses his father out of fear of being portrayed as weak. Plan: follow-up needed  Patients Problems:  Patient Active Problem List   Diagnosis Date Noted  . Alcohol use disorder, severe, dependence (HCC) 08/30/2020  . Thrombosed external hemorrhoid 02/10/2013  . Hypertension 12/30/2010  . Alcohol ingestion of one to four  drinks per day 12/30/2010  . Tobacco use disorder 12/30/2010  . Back pain 12/30/2010  . Annual physical exam 12/02/2010     Family Program: Family present? No   Name of family member(s): N/A  UDS collected: No Results: N/A  AA/NA attended?: No  Sponsor?: No

## 2020-09-08 ENCOUNTER — Ambulatory Visit (INDEPENDENT_AMBULATORY_CARE_PROVIDER_SITE_OTHER): Payer: No Payment, Other | Admitting: Behavioral Health

## 2020-09-08 ENCOUNTER — Other Ambulatory Visit: Payer: Self-pay

## 2020-09-08 DIAGNOSIS — F1020 Alcohol dependence, uncomplicated: Secondary | ICD-10-CM

## 2020-09-11 ENCOUNTER — Ambulatory Visit (INDEPENDENT_AMBULATORY_CARE_PROVIDER_SITE_OTHER): Payer: No Payment, Other | Admitting: Behavioral Health

## 2020-09-11 ENCOUNTER — Other Ambulatory Visit: Payer: Self-pay

## 2020-09-11 DIAGNOSIS — F1020 Alcohol dependence, uncomplicated: Secondary | ICD-10-CM | POA: Diagnosis not present

## 2020-09-12 NOTE — Group Note (Signed)
Group Topic: Identifying Triggers  Group Date: 09/11/2020 Start Time:  9:00 AM End Time: 12:00 PM Facilitators: Mamie Nick, Counselor  Department: [facility]  Number of Participants: 3  Group Focus: acceptance, affirmation, anger management, anxiety, check in, coping skills, daily focus, depression, family, forgiveness, healthy friendships, and relapse prevention Treatment Modality:  Cognitive Behavioral Therapy Interventions utilized were clarification, confrontation, exploration, story telling, and support Purpose: enhance coping skills, explore maladaptive thinking, express feelings, express irrational fears, improve communication skills, increase insight, regain self-worth, reinforce self-care, relapse prevention strategies, and trigger / craving management   Name: Eugene Franco Date of Birth: [birth year]  MR: [id number]    Level of Participation: active Quality of Participation: attentive, cooperative and motivated Interactions with others: gave feedback Mood/Affect: appropriate Triggers (if applicable): N/A Cognition: coherent/clear, insightful and logical Progress: Moderate Response: Clt checked-in by sharing that his peaks are "I went to my son's basketball game this weekend. I helped my nephew move into his new apartment. 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 He shared that his valley to be "rebuilding trust with wife again". Clt reports the trust with his wife was damaged due to his alcohol use. He reports he is taking the necessary steps to work towards rebuilding this trust relationship with his significant other. Clt noted his current trigger to be "driving pass the [facility] store"; however, he shares that his current medication helps with these urges and cravings. Plan: follow-up needed  Patients Problems:  Patient Active Problem List   Diagnosis Date Noted  . Alcohol use disorder, severe, dependence (HCC) 08/30/2020  . Thrombosed external  hemorrhoid 02/10/2013  . Hypertension 12/30/2010  . Alcohol ingestion of one to four drinks per day 12/30/2010  . Tobacco use disorder 12/30/2010  . Back pain 12/30/2010  . Annual physical exam 12/02/2010     Family Program: Family present? No   Name of family member(s): N/A  UDS collected: No Results: N/A  AA/NA attended?: No  Sponsor?: No

## 2020-09-13 ENCOUNTER — Ambulatory Visit (INDEPENDENT_AMBULATORY_CARE_PROVIDER_SITE_OTHER): Payer: No Payment, Other | Admitting: Behavioral Health

## 2020-09-13 ENCOUNTER — Other Ambulatory Visit: Payer: Self-pay

## 2020-09-13 DIAGNOSIS — F1020 Alcohol dependence, uncomplicated: Secondary | ICD-10-CM

## 2020-09-15 ENCOUNTER — Ambulatory Visit (INDEPENDENT_AMBULATORY_CARE_PROVIDER_SITE_OTHER): Payer: No Payment, Other | Admitting: Behavioral Health

## 2020-09-15 ENCOUNTER — Other Ambulatory Visit: Payer: Self-pay

## 2020-09-15 DIAGNOSIS — F1020 Alcohol dependence, uncomplicated: Secondary | ICD-10-CM

## 2020-09-15 NOTE — Group Note (Signed)
Group Topic: Guilt/Shame  Group Date: 09/15/2020 Start Time:  9:00 AM End Time: 12:00 PM Facilitators: Mamie Nick, Counselor  Department: [facility]  Number of Participants: 4  Group Focus: abuse issues, acceptance, affirmation, anxiety, check in, coping skills, family, feeling awareness/expression, forgiveness, relapse prevention, relaxation, self-awareness, and self-esteem Treatment Modality:  Cognitive Behavioral Therapy Interventions utilized were clarification, confrontation, exploration, other guided meditation, and support Purpose: enhance coping skills, explore maladaptive thinking, express feelings, express irrational fears, improve communication skills, increase insight, regain self-worth, reinforce self-care, relapse prevention strategies, and trigger / craving management   Name: Eugene Franco Date of Birth: [birth year]  MR: [id number]    Level of Participation: active Quality of Participation: attentive, cooperative, motivated and offered feedback Interactions with others: gave feedback Mood/Affect: appropriate Triggers (if applicable): N/A Cognition: coherent/clear, insightful and logical Progress: Moderate Response: Clt checked in by sharing his peaks are "My middle son and I were riding in the car yesterday and he looked at me and said 'Dad, I'm proud of you'". Clt reports "it meant the world to me" to hear my son tell me that he was proud of me. Clt shared that his valleys are "trying to work towards improve his relationship with wife". Clt shared that his word of the day is "keep striving". Clt engaged well in group discussion by providing supportive and insight verbal feedback. Plan: follow-up needed  Patients Problems:  Patient Active Problem List   Diagnosis Date Noted  . Alcohol use disorder, severe, dependence (HCC) 08/30/2020  . Thrombosed external hemorrhoid 02/10/2013  . Hypertension 12/30/2010  . Alcohol ingestion of one to  four drinks per day 12/30/2010  . Tobacco use disorder 12/30/2010  . Back pain 12/30/2010  . Annual physical exam 12/02/2010     Family Program: Family present? No   Name of family member(s): N/A  UDS collected: No Results: N/A  AA/NA attended?: No  Sponsor?: No

## 2020-09-16 NOTE — Group Note (Signed)
Group Topic: Feelings and Emotions  Group Date: 09/08/2020 Start Time:  9:00 AM End Time: 12:00 PM Facilitators: Mamie Nick, Counselor  Department: [facility]  Number of Participants: 3  Group Focus: acceptance, anger management, anxiety, check in, coping skills, family, and relapse prevention Treatment Modality:  Cognitive Behavioral Therapy Interventions utilized were clarification, confrontation, and exploration Purpose: enhance coping skills, explore maladaptive thinking, express feelings, express irrational fears, improve communication skills, increase insight, regain self-worth, reinforce self-care, relapse prevention strategies, and trigger / craving management   Name: Amiri Riechers Date of Birth: October 05, 1975  MR: [id number]    Level of Participation: active Quality of Participation: attentive, cooperative, motivated and offered feedback Interactions with others: gave feedback Mood/Affect: appropriate Triggers (if applicable): N/A Cognition: coherent/clear, insightful and logical Progress: Moderate Response: Clt checked in by sharing his peaks and valleys - Peaks: "My son is doing well in school and that makes me proud" ... Valleys: "Having to drive pass the [facility] store on my way home". Clt reports seeing the [facility] store as a trigger; however, he reports his current medications help him to be able to make an informed decision. Clt processed the idea of considering taking a different route home during the early stages of his recovery. Clt was receptive to feedback offered to him by his peers.  Plan: follow-up needed  Patients Problems:  Patient Active Problem List   Diagnosis Date Noted  . Alcohol use disorder, severe, dependence (HCC) 08/30/2020  . Thrombosed external hemorrhoid 02/10/2013  . Hypertension 12/30/2010  . Alcohol ingestion of one to four drinks per day 12/30/2010  . Tobacco use disorder 12/30/2010  . Back pain 12/30/2010  . Annual  physical exam 12/02/2010     Family Program: Family present? No   Name of family member(s): N/A  UDS collected: No Results: N/A  AA/NA attended?: No  Sponsor?: No

## 2020-09-18 ENCOUNTER — Other Ambulatory Visit: Payer: Self-pay

## 2020-09-18 ENCOUNTER — Ambulatory Visit (HOSPITAL_COMMUNITY): Payer: No Payment, Other | Admitting: Behavioral Health

## 2020-09-20 ENCOUNTER — Ambulatory Visit (HOSPITAL_COMMUNITY): Payer: Self-pay | Admitting: Behavioral Health

## 2020-09-20 NOTE — Group Note (Signed)
Group Topic: General Coping Skills  Group Date: 09/13/2020 Start Time:  9:00 AM End Time: 12:00 PM Facilitators: Mamie Nick, Counselor  Department: [facility]  Number of Participants: 3  Group Focus: affirmation, check in, communication, coping skills, depression, family, feeling awareness/expression, forgiveness, relapse prevention, and relaxation Treatment Modality:  Cognitive Behavioral Therapy Interventions utilized were clarification, confrontation, and exploration Purpose: enhance coping skills, explore maladaptive thinking, express feelings, express irrational fears, improve communication skills, increase insight, regain self-worth, reinforce self-care, relapse prevention strategies, and trigger / craving management   Name: Eugene Franco Date of Birth: 1975-08-15  MR: [id number]    Level of Participation: active Quality of Participation: attentive, cooperative, motivated and offered feedback Interactions with others: gave feedback Mood/Affect: appropriate Triggers (if applicable): N/A  Cognition: coherent/clear and insightful Progress: Moderate Response: Clt checked-in by sharing that "yesterday made 30 days sober". Clt reports he was proud to say that he made it sober for 1 full month. He shared that since he made it to 30 days his next goal is 60 days then 90 days and so forth. Herman offered supportive verbal feedback during group discussion. Plan: follow-up needed  Patients Problems:  Patient Active Problem List   Diagnosis Date Noted   Alcohol use disorder, severe, dependence (HCC) 08/30/2020   Thrombosed external hemorrhoid 02/10/2013   Hypertension 12/30/2010   Alcohol ingestion of one to four drinks per day 12/30/2010   Tobacco use disorder 12/30/2010   Back pain 12/30/2010   Annual physical exam 12/02/2010     Family Program: Family present? No   Name of family member(s): N/A  UDS collected: No Results: N/A  AA/NA  attended?: No  Sponsor?: No

## 2020-09-22 ENCOUNTER — Other Ambulatory Visit: Payer: Self-pay

## 2020-09-22 ENCOUNTER — Ambulatory Visit (HOSPITAL_COMMUNITY): Payer: No Payment, Other | Admitting: Behavioral Health

## 2020-09-25 ENCOUNTER — Other Ambulatory Visit: Payer: Self-pay

## 2020-09-25 ENCOUNTER — Ambulatory Visit (INDEPENDENT_AMBULATORY_CARE_PROVIDER_SITE_OTHER): Payer: No Payment, Other | Admitting: Behavioral Health

## 2020-09-25 DIAGNOSIS — F1020 Alcohol dependence, uncomplicated: Secondary | ICD-10-CM | POA: Diagnosis not present

## 2020-09-25 NOTE — Group Note (Signed)
Group Topic: Relapse Prevention  Group Date: 09/25/2020 Start Time:  9:00 AM End Time: 12:00 PM Facilitators: Mamie Nick, Counselor  Department: [facility]  Number of Participants: 6  Group Focus: affirmation, anger management, check in, communication, coping skills, dual diagnosis, family, feeling awareness/expression, impulsivity, loss/grief issues, relapse prevention, relaxation, and self-esteem Treatment Modality:  Cognitive Behavioral Therapy Interventions utilized were clarification, confrontation, exploration, and support Purpose: enhance coping skills, explore maladaptive thinking, express feelings, express irrational fears, improve communication skills, increase insight, regain self-worth, reinforce self-care, relapse prevention strategies, and trigger / craving management   Name: Eugene Franco Date of Birth: 1975-08-12  MR: [id number]    Level of Participation: moderate Quality of Participation: attentive, cooperative, motivated and offered feedback Interactions with others: gave feedback Mood/Affect: appropriate Triggers (if applicable): N/A Cognition: coherent/clear, insightful and logical Progress: Moderate Response: Client checked in by sharing that he recently had 2 people to die this past weekend. Client appeared to be somewhat withdrawn during group discussion today. He shared that he has noticed that he has been eating sweets and sugary foods lately. He reports he's noticed and increase in his weight due to his recent eating habits. Client was able to relate to another group member who reported the same eating habits. Client also shared that he is having a difficult time accepting "that I won't be the life of the party anymore". He reports that since he is no longer drinking he has not been able nor does he think he'll be able to enjoy himself at social outings. Client processed these thoughts and emotions during group discussion. Client  was open to verbal feedback offered by his peers. Plan: follow-up needed  Patients Problems:  Patient Active Problem List   Diagnosis Date Noted  . Alcohol use disorder, severe, dependence (HCC) 08/30/2020  . Thrombosed external hemorrhoid 02/10/2013  . Hypertension 12/30/2010  . Alcohol ingestion of one to four drinks per day 12/30/2010  . Tobacco use disorder 12/30/2010  . Back pain 12/30/2010  . Annual physical exam 12/02/2010     Family Program: Family present? No   Name of family member(s): N/A  UDS collected: No Results: N/A  AA/NA attended?: No  Sponsor?: No

## 2020-09-27 ENCOUNTER — Other Ambulatory Visit: Payer: Self-pay

## 2020-09-27 ENCOUNTER — Ambulatory Visit (HOSPITAL_COMMUNITY): Payer: No Payment, Other | Admitting: Behavioral Health

## 2020-09-27 DIAGNOSIS — F1020 Alcohol dependence, uncomplicated: Secondary | ICD-10-CM

## 2020-09-29 ENCOUNTER — Ambulatory Visit (HOSPITAL_COMMUNITY): Payer: Self-pay

## 2020-10-02 ENCOUNTER — Ambulatory Visit (HOSPITAL_COMMUNITY): Payer: No Payment, Other | Admitting: Behavioral Health

## 2020-10-02 NOTE — Group Note (Signed)
Group Topic: General Coping Skills  Group Date: 09/27/2020 Start Time:  9:00 AM End Time: 12:00 PM Facilitators: Mamie Nick, Counselor  Department: [facility]  Number of Participants: 6  Group Focus: anger management, anxiety, check in, concentration, coping skills, depression, dual diagnosis, family, feeling awareness/expression, loss/grief issues, relapse prevention, relaxation, and substance abuse education Treatment Modality:  Cognitive Behavioral Therapy Interventions utilized were clarification, confrontation, exploration, group exercise, and support Purpose: enhance coping skills, explore maladaptive thinking, express feelings, express irrational fears, improve communication skills, increase insight, regain self-worth, reinforce self-care, relapse prevention strategies, and trigger / craving management   Name: Eugene Franco Date of Birth: 11/19/1975  MR: [id number]    Level of Participation: active Quality of Participation: attentive, cooperative, motivated and offered feedback Interactions with others: gave feedback Mood/Affect: appropriate Triggers (if applicable): N/A Cognition: coherent/clear, insightful and logical Progress: Significant Response: Client checked in by sharing that he was offered the full time job at an [employer], [employer]. He reports having bittersweet emotions as it relates to going back to work. Clt reports he is hoping that he will be able to work out a schedule with his manager that will allow him to continue attending group. He also reports feeling like he has to provide for his family. Clt shared that his word for today is "thankful". Clt was receptive and attentive during group discussion. Plan: follow-up needed  Patients Problems:  Patient Active Problem List   Diagnosis Date Noted  . Alcohol use disorder, severe, dependence (HCC) 08/30/2020  . Thrombosed external hemorrhoid 02/10/2013  . Hypertension 12/30/2010  .  Alcohol ingestion of one to four drinks per day 12/30/2010  . Tobacco use disorder 12/30/2010  . Back pain 12/30/2010  . Annual physical exam 12/02/2010     Family Program: Family present? No   Name of family member(s): N/A  UDS collected: No Results: N/A  AA/NA attended?: No  Sponsor?: No

## 2020-10-04 ENCOUNTER — Ambulatory Visit (HOSPITAL_COMMUNITY): Payer: Self-pay | Admitting: Behavioral Health

## 2020-10-06 ENCOUNTER — Ambulatory Visit (HOSPITAL_COMMUNITY): Payer: Self-pay | Admitting: Behavioral Health

## 2020-10-09 ENCOUNTER — Ambulatory Visit (HOSPITAL_COMMUNITY): Payer: Self-pay | Admitting: Behavioral Health

## 2020-10-11 ENCOUNTER — Ambulatory Visit (HOSPITAL_COMMUNITY): Payer: Self-pay | Admitting: Behavioral Health

## 2020-10-13 ENCOUNTER — Ambulatory Visit (HOSPITAL_COMMUNITY): Payer: Self-pay | Admitting: Behavioral Health

## 2020-10-16 ENCOUNTER — Ambulatory Visit (HOSPITAL_COMMUNITY): Payer: Self-pay

## 2020-10-18 ENCOUNTER — Ambulatory Visit (HOSPITAL_COMMUNITY): Payer: Self-pay | Admitting: Behavioral Health

## 2020-10-20 ENCOUNTER — Ambulatory Visit (HOSPITAL_COMMUNITY): Payer: Self-pay

## 2020-10-23 ENCOUNTER — Ambulatory Visit (HOSPITAL_COMMUNITY): Payer: Self-pay

## 2020-10-25 ENCOUNTER — Ambulatory Visit (HOSPITAL_COMMUNITY): Payer: Self-pay

## 2020-10-27 ENCOUNTER — Ambulatory Visit (HOSPITAL_COMMUNITY): Payer: Self-pay

## 2020-11-07 ENCOUNTER — Other Ambulatory Visit: Payer: Self-pay

## 2020-11-07 ENCOUNTER — Ambulatory Visit (INDEPENDENT_AMBULATORY_CARE_PROVIDER_SITE_OTHER): Payer: No Payment, Other | Admitting: Behavioral Health

## 2020-11-07 DIAGNOSIS — F1020 Alcohol dependence, uncomplicated: Secondary | ICD-10-CM | POA: Diagnosis not present

## 2020-11-08 ENCOUNTER — Encounter (HOSPITAL_COMMUNITY): Payer: Self-pay | Admitting: Physician Assistant

## 2020-11-08 ENCOUNTER — Encounter (HOSPITAL_COMMUNITY): Payer: Self-pay

## 2020-11-08 ENCOUNTER — Telehealth (HOSPITAL_COMMUNITY): Payer: No Payment, Other | Admitting: Physician Assistant

## 2020-11-08 NOTE — Progress Notes (Signed)
   THERAPIST PROGRESS NOTE  Session Time: 33 minutes  Participation Level: Active  Behavioral Response: Neat and Well GroomedAlertEuthymic  Type of Therapy: Individual Therapy  Treatment Goals addressed: Coping  Interventions: CBT  Summary: Eugene Franco is a 45 y.o. male who presents to [facility] for a scheduled therapy session with this Clinical research associate. Clt shared that he has been maintaining his full time employment. He shares that he has been experiencing increased stress due to his marital issues. Clt shared that he had his DWI assessment completed through [facility] and was required to complete 40 hours of substance use treatment. Clt reports that he continues to work towards maintaining his sobriety while repairing his family relationships.  Suicidal/Homicidal: No  Therapist Response: Therapist offered encouragement and support. Therapist reviewed clt's goal of developing healthy coping skills by asking clt to discuss the skills he's utilized in maintaining his sobriety. Therapist typed a letter to report clt's participation in [facility] and number of hours completed.  Plan: Clt reports he will call facility to request next follow-up appointment  Diagnosis: Axis I: Alcohol Abuse    Axis II: No diagnosis    Mamie Nick, Counselor 11/08/2020

## 2020-11-13 ENCOUNTER — Other Ambulatory Visit (HOSPITAL_COMMUNITY): Payer: Self-pay | Admitting: Physician Assistant

## 2020-11-13 DIAGNOSIS — I10 Essential (primary) hypertension: Secondary | ICD-10-CM

## 2020-11-13 DIAGNOSIS — G4700 Insomnia, unspecified: Secondary | ICD-10-CM

## 2020-11-13 DIAGNOSIS — F1020 Alcohol dependence, uncomplicated: Secondary | ICD-10-CM

## 2020-11-13 DIAGNOSIS — F411 Generalized anxiety disorder: Secondary | ICD-10-CM

## 2020-11-13 MED ORDER — AMLODIPINE BESYLATE 10 MG PO TABS: ORAL_TABLET | ORAL | 0 refills | Status: DC

## 2020-11-13 MED ORDER — HYDROCHLOROTHIAZIDE 25 MG PO TABS: ORAL_TABLET | ORAL | 0 refills | Status: DC

## 2020-11-13 MED ORDER — ESCITALOPRAM OXALATE 10 MG PO TABS: 10.0000 mg | ORAL_TABLET | Freq: Every day | ORAL | 1 refills | Status: DC

## 2020-11-13 MED ORDER — TRAZODONE HCL 50 MG PO TABS: 50.0000 mg | ORAL_TABLET | Freq: Every evening | ORAL | 1 refills | Status: DC | PRN

## 2020-11-13 NOTE — Progress Notes (Signed)
Patient's medication resent to pharmacy of choice.

## 2020-11-13 NOTE — Progress Notes (Signed)
This patient is being seen by provider. Patient was last seen on 11/08/2020 but was unable to complete encounter due to phone disconnection. Patient was requesting refills on his current prescription of medications that were originally being filled by Dr. Leonette Most at [facility]. Patient's medications to be e-prescribed to pharmacy of choice. Patient's new appointment will be rescheduled for 12/07/2020.

## 2020-12-07 ENCOUNTER — Encounter (HOSPITAL_COMMUNITY): Payer: No Payment, Other | Admitting: Physician Assistant

## 2020-12-22 ENCOUNTER — Telehealth (INDEPENDENT_AMBULATORY_CARE_PROVIDER_SITE_OTHER): Payer: No Payment, Other | Admitting: Physician Assistant

## 2020-12-22 ENCOUNTER — Other Ambulatory Visit: Payer: Self-pay

## 2020-12-22 DIAGNOSIS — F411 Generalized anxiety disorder: Secondary | ICD-10-CM

## 2020-12-22 DIAGNOSIS — G4700 Insomnia, unspecified: Secondary | ICD-10-CM | POA: Diagnosis not present

## 2020-12-22 NOTE — Progress Notes (Signed)
Psychiatric Initial Adult Assessment   Virtual Visit via Telephone Note  I connected with Eugene Franco on 12/22/20 at  1:30 PM EDT by telephone and verified that I am speaking with the correct person using two identifiers.  Location: Patient: Home Provider: Clinic   I discussed the limitations, risks, security and privacy concerns of performing an evaluation and management service by telephone and the availability of in person appointments. I also discussed with the patient that there may be a patient responsible charge related to this service. The patient expressed understanding and agreed to proceed.  Follow Up Instructions:   I discussed the assessment and treatment plan with the patient. The patient was provided an opportunity to ask questions and all were answered. The patient agreed with the plan and demonstrated an understanding of the instructions.   The patient was advised to call back or seek an in-person evaluation if the symptoms worsen or if the condition fails to improve as anticipated.  I provided 30 minutes of non-face-to-face time during this encounter.  Meta Hatchet, PA   Patient Identification: Eugene Franco MRN:  [id number] Date of Evaluation:  12/22/2020 Referral Source: N/A Chief Complaint:  Psychiatric evaluation Visit Diagnosis:    ICD-10-CM   1. Insomnia, unspecified type  G47.00     2. Anxiety state  F41.1       History of Present Illness:    Eugene Franco is a 45 year old male with a past psychiatric history significant for alcohol use disorder who presents to [facility] via virtual telephone visit for psychiatric evaluation.  Patient is currently being managed on the following medications:  Trazodone 50 mg at bedtime Lexapro 10 mg daily  Patient states that he was seen at a psychiatric facility in [facility] due to his alcohol dependence.  Patient states that he was discharged on February 15th.  Patient  denies any major psychiatric issues at this time.  Patient does express mild anxiety he rates a 3-4 out of 10.  Triggers to his anxiety include driving near and interacting with police.  Patient denies any depressive symptoms at this time.  Patient states that he has not been hospitalized due to mental health in the past.  Patient denies past suicide attempts.  A GAD-7 screen was performed with the patient scoring an 8.  Patient is calm, cooperative, and fully engaged in conversation during the encounter.  Besides feeling hot due to being out in the weather, patient states that he is doing all right.  Patient denies suicidal or homicidal ideations.  He further denies auditory or visual hallucinations.  Patient endorses fair sleep and receives on average 4 to 5 hours of intermittent sleep.  Patient endorses good appetite and eats on average 5 meals per day with snacking in between.  Patient reports that he slipped up and last drank alcohol Father's Day.  Patient endorses tobacco use and states that a pack of cigarettes will typically last him roughly 2 weeks.  Patient denies illicit drug use but states that he has used marijuana in the past.  Associated Signs/Symptoms: Depression Symptoms:  anhedonia, psychomotor agitation, fatigue, impaired memory, recurrent thoughts of death, anxiety, loss of energy/fatigue, decreased labido, (Hypo) Manic Symptoms:  Elevated Mood, Flight of Ideas, Grandiosity, Irritable Mood, Labiality of Mood, Anxiety Symptoms:  Excessive Worry, Obsessive Compulsive Symptoms:   Patient states he is very very clean, Specific Phobias, Psychotic Symptoms:   None PTSD Symptoms: Had a traumatic exposure:  Patient denies traumatic  exposure Had a traumatic exposure in the last month:  n/a Re-experiencing:  None Hypervigilance:  No Hyperarousal:  None Avoidance:  None  Past Psychiatric History:  Alcohol dependence Insomnia Anxiety  Previous Psychotropic Medications: Yes    Substance Abuse History in the last 12 months:  Yes.    Consequences of Substance Abuse: Medical Consequences:  None Legal Consequences:  None Family Consequences:  None Blackouts:  None DT's: N/A Withdrawal Symptoms:   None  Past Medical History:  Past Medical History:  Diagnosis Date   Arthritis    GERD (gastroesophageal reflux disease)    Hypertension    History reviewed. No pertinent surgical history.  Family Psychiatric History:  None reported  Family History:  Family History  Problem Relation Age of Onset   Arthritis Mother    Arthritis Father    Cancer Sister        breast   Cancer Sister    Arthritis Maternal Grandmother    Hypertension Maternal Grandmother    Arthritis Maternal Grandfather    Hypertension Maternal Grandfather     Social History:   Social History   Socioeconomic History   Marital status: Married    Spouse name: Not on file   Number of children: Not on file   Years of education: Not on file   Highest education level: Not on file  Occupational History   Not on file  Tobacco Use   Smoking status: Every Day    Packs/day: 0.25    Pack years: 0.00    Types: Cigarettes   Smokeless tobacco: Never  Substance and Sexual Activity   Alcohol use: Yes   Drug use: Yes    Types: Marijuana   Sexual activity: Not on file  Other Topics Concern   Not on file  Social History Narrative   Not on file   Social Determinants of Health   Financial Resource Strain: Not on file  Food Insecurity: Not on file  Transportation Needs: Not on file  Physical Activity: Not on file  Stress: Not on file  Social Connections: Not on file    Additional Social History:  Patient states he is employed  Allergies:  No Known Allergies  Metabolic Disorder Labs: No results found for: HGBA1C, MPG No results found for: PROLACTIN Lab Results  Component Value Date   CHOL 205 (H) 11/22/2010   TRIG 95.0 11/22/2010   HDL 67.10 11/22/2010   CHOLHDL 3  11/22/2010   VLDL 19.0 11/22/2010   No results found for: TSH  Therapeutic Level Labs: No results found for: LITHIUM No results found for: CBMZ No results found for: VALPROATE  Current Medications: Current Outpatient Medications  Medication Sig Dispense Refill   amLODipine (NORVASC) 10 MG tablet amlodipine 10 mg tablet  TAKE 1 TABLET BY MOUTH ONCE DAILY FOR 90 DAYS 90 tablet 0   docusate sodium (COLACE) 100 MG capsule Take 1 capsule (100 mg total) by mouth every 12 (twelve) hours. (Patient not taking: Reported on 05/18/2015) 60 capsule 0   escitalopram (LEXAPRO) 10 MG tablet Take 1 tablet (10 mg total) by mouth daily for 90 doses. 30 tablet 1   hydrochlorothiazide (HYDRODIURIL) 25 MG tablet hydrochlorothiazide 25 mg tablet  TAKE 1 TABLET BY MOUTH ONCE DAILY FOR 90 DAYS 90 tablet 0   ondansetron (ZOFRAN ODT) 4 MG disintegrating tablet Take 1 tablet (4 mg total) by mouth every 8 (eight) hours as needed for nausea. (Patient not taking: Reported on 08/02/2016) 10 tablet 0   oxyCODONE-acetaminophen (  PERCOCET) 10-325 MG tablet Take 1 tablet by mouth every 6 (six) hours.     sildenafil (VIAGRA) 100 MG tablet sildenafil 100 mg tablet  TAKE ONE TABLET BY MOUTH AS NEEDED ONCE DAILY FOR 30 DAYS.     traZODone (DESYREL) 50 MG tablet Take 1 tablet (50 mg total) by mouth at bedtime and may repeat dose one time if needed. 60 tablet 1   No current facility-administered medications for this visit.    Musculoskeletal: Strength & Muscle Tone: Unable to assess due to telemedicine visit Gait & Station: Unable to assess due to telemedicine visit Patient leans: Unable to assess due to telemedicine visit  Psychiatric Specialty Exam: Review of Systems  Psychiatric/Behavioral:  Positive for sleep disturbance. Negative for decreased concentration, dysphoric mood, hallucinations, self-injury and suicidal ideas. The patient is nervous/anxious. The patient is not hyperactive.    There were no vitals taken for this  visit.There is no height or weight on file to calculate BMI.  General Appearance: Unable to assess due to telemedicine visit  Eye Contact:  Unable to assess due to telemedicine visit  Speech:  Clear and Coherent and Normal Rate  Volume:  Normal  Mood:  Anxious and Euthymic  Affect:  Appropriate and Congruent  Thought Process:  Coherent and Descriptions of Associations: Intact  Orientation:  Full (Time, Place, and Person)  Thought Content:  WDL  Suicidal Thoughts:  No  Homicidal Thoughts:  No  Memory:  Immediate;   Good Recent;   Good Remote;   Good  Judgement:  Good  Insight:  Fair  Psychomotor Activity:  Normal  Concentration:  Concentration: Good and Attention Span: Good  Recall:  Good  Fund of Knowledge:Good  Language: Good  Akathisia:  NA  Handed:  Right  AIMS (if indicated):  not done  Assets:  Communication Skills Desire for Improvement Housing Vocational/Educational  ADL's:  Intact  Cognition: WNL  Sleep:  Fair   Screenings: GAD-7    Flowsheet Row [facility] 12[facility]  Total GAD-7 Score 8      PHQ2-9    Flowsheet Row Video Visit from 12/22/2020 in [facility] Office Visit from 08/24/2020 in [facility]  PHQ-2 Total Score 0 0      Flowsheet Row Video Visit from 12/22/2020 in [facility] Office Visit from 08/24/2020 in [facility]  C-SSRS RISK CATEGORY No Risk No Risk       Assessment and Plan:   Eugene Franco is a 45 year old male with a past psychiatric history significant for alcohol use disorder who presents to [facility] via virtual telephone visit for psychiatric evaluation.  Patient denies any major issues with his mental health.  Patient is currently taking trazodone 50 mg at bedtime and Lexapro 10 mg daily.  Patient denies a need for refills at  this time.  Patient states that in the future he would like to be taken off some of his medications.  Patient to continue taking his psychiatric meds as prescribed.  1. Insomnia, unspecified type Patient to continue taking trazodone 50 mg at bedtime for the management of his insomnia  2. Anxiety state Patient to continue taking Lexapro 10 mg daily for the management of his anxiety  Patient to follow-up in 6 weeks Provider spent a total of 30 minutes with the patient/reviewing patient's chart  Meta Hatchet, PA 6/24/20221:57 PM

## 2020-12-26 ENCOUNTER — Encounter (HOSPITAL_COMMUNITY): Payer: Self-pay | Admitting: Physician Assistant

## 2021-02-06 ENCOUNTER — Other Ambulatory Visit: Payer: Self-pay

## 2021-02-06 ENCOUNTER — Telehealth (HOSPITAL_COMMUNITY): Payer: Commercial Managed Care - HMO | Admitting: Physician Assistant

## 2021-03-15 ENCOUNTER — Encounter (HOSPITAL_COMMUNITY): Payer: Self-pay | Admitting: Physician Assistant

## 2021-03-15 ENCOUNTER — Telehealth (INDEPENDENT_AMBULATORY_CARE_PROVIDER_SITE_OTHER): Payer: No Payment, Other | Admitting: Physician Assistant

## 2021-03-15 DIAGNOSIS — F411 Generalized anxiety disorder: Secondary | ICD-10-CM

## 2021-03-15 DIAGNOSIS — G4700 Insomnia, unspecified: Secondary | ICD-10-CM

## 2021-03-15 DIAGNOSIS — I10 Essential (primary) hypertension: Secondary | ICD-10-CM | POA: Diagnosis not present

## 2021-03-15 MED ORDER — ESCITALOPRAM OXALATE 10 MG PO TABS: 10.0000 mg | ORAL_TABLET | Freq: Every day | ORAL | 1 refills | Status: DC

## 2021-03-15 MED ORDER — TRAZODONE HCL 50 MG PO TABS: 50.0000 mg | ORAL_TABLET | Freq: Every evening | ORAL | 1 refills | Status: DC | PRN

## 2021-03-15 MED ORDER — HYDROCHLOROTHIAZIDE 25 MG PO TABS: 25.0000 mg | ORAL_TABLET | Freq: Every day | ORAL | 0 refills | Status: AC

## 2021-03-15 MED ORDER — AMLODIPINE BESYLATE 10 MG PO TABS: 10.0000 mg | ORAL_TABLET | Freq: Every day | ORAL | 0 refills | Status: AC

## 2021-03-15 NOTE — Progress Notes (Signed)
BH MD/PA/NP OP Progress Note  Virtual Visit via Telephone Note  I connected with Eugene Franco on 03/15/21 at  4:30 PM EDT by telephone and verified that I am speaking with the correct person using two identifiers.  Location: Patient: Home Provider: Clinic   I discussed the limitations, risks, security and privacy concerns of performing an evaluation and management service by telephone and the availability of in person appointments. I also discussed with the patient that there may be a patient responsible charge related to this service. The patient expressed understanding and agreed to proceed.  Follow Up Instructions:  I discussed the assessment and treatment plan with the patient. The patient was provided an opportunity to ask questions and all were answered. The patient agreed with the plan and demonstrated an understanding of the instructions.   The patient was advised to call back or seek an in-person evaluation if the symptoms worsen or if the condition fails to improve as anticipated.  I provided 30 minutes of non-face-to-face time during this encounter.  Meta Hatchet, PA   03/15/2021 11:31 PM Eugene Franco  MRN:  [id number]  Chief Complaint: Follow up and medication management  HPI:   Eugene Franco is a 45 year old male with a past psychiatric history significant for alcohol use disorder, anxiety, and insomnia who presents to [facility] via virtual telephone visit for follow-up and medication management.  Patient is currently being managed on the following medications:  Escitalopram 10 mg daily Trazodone 50 mg at bedtime  In addition to his psychiatric medications, patient expresses that he is in need of his hypertension and fluid pills.  Patient was informed that [facility] does not normally prescribe medications that are outside the realm of psychiatry.  Patient was informed that since he is running low on vital medications,  provider would prescribe a 30-day supply of each medication.  Patient was instructed to set up with a primary care provider so that he can be prescribed his hypertension medication and fluid pills.  Patient vocalized understanding.  When asked about depressive episodes, patient replied, "I'm depressed as shit."  Patient reassured provider that he has no intentions of killing himself.  He attributes his depression to multiple factors such as gas prices.  Patient also endorses anxiety which he rates a 5 out of 10.  Patient expresses that without his escitalopram medication, he is unable to function.  Patient reports that his children dropped him off at work due to fear of encountering the police.  Patient's current stressors include his son recently getting fired from his job, wife pestering him about his alcohol consumption, and trying to limit his alcohol use.  Patient states that it has been hard to limit his alcohol consumption due to everybody drinking around him.  Whenever he gets the urge, patient states that he will smoke cigarettes.  A PHQ-9 screen was performed with the patient scoring a 5.  A GAD-7 screen was also performed with the patient scoring an 11.  Patient is alert and oriented x4, calm, cooperative, and fully engaged in conversation during the encounter.  Patient states that he is much happier now, now that he has made it to his encounter.  Patient denies suicidal or homicidal ideations.  He further denies auditory or visual hallucinations.  Patient endorses poor sleep and receives on average 3 to 4 hours of sleep each night.  Patient endorses okay appetite and eats on average 2-3 meals per day.  Patient endorses alcohol consumption  and states that he has 2-3 beers a day.  Patient denies tobacco use.  Patient endorses illicit drug use in the form of marijuana.  Visit Diagnosis:    ICD-10-CM   1. Anxiety state  F41.1 escitalopram (LEXAPRO) 10 MG tablet    2. Primary hypertension  I10  hydrochlorothiazide (HYDRODIURIL) 25 MG tablet    amLODipine (NORVASC) 10 MG tablet    3. Insomnia, unspecified type  G47.00 traZODone (DESYREL) 50 MG tablet      Past Psychiatric History:  Anxiety Insomnia  Past Medical History:  Past Medical History:  Diagnosis Date   Arthritis    GERD (gastroesophageal reflux disease)    Hypertension    No past surgical history on file.  Family Psychiatric History:  None reported  Family History:  Family History  Problem Relation Age of Onset   Arthritis Mother    Arthritis Father    Cancer Sister        breast   Cancer Sister    Arthritis Maternal Grandmother    Hypertension Maternal Grandmother    Arthritis Maternal Grandfather    Hypertension Maternal Grandfather     Social History:  Social History   Socioeconomic History   Marital status: Married    Spouse name: Not on file   Number of children: Not on file   Years of education: Not on file   Highest education level: Not on file  Occupational History   Not on file  Tobacco Use   Smoking status: Every Day    Packs/day: 0.25    Types: Cigarettes   Smokeless tobacco: Never  Substance and Sexual Activity   Alcohol use: Yes   Drug use: Yes    Types: Marijuana   Sexual activity: Not on file  Other Topics Concern   Not on file  Social History Narrative   Not on file   Social Determinants of Health   Financial Resource Strain: Not on file  Food Insecurity: Not on file  Transportation Needs: Not on file  Physical Activity: Not on file  Stress: Not on file  Social Connections: Not on file    Allergies: No Known Allergies  Metabolic Disorder Labs: No results found for: HGBA1C, MPG No results found for: PROLACTIN Lab Results  Component Value Date   CHOL 205 (H) 11/22/2010   TRIG 95.0 11/22/2010   HDL 67.10 11/22/2010   CHOLHDL 3 11/22/2010   VLDL 19.0 11/22/2010   No results found for: TSH  Therapeutic Level Labs: No results found for: LITHIUM No  results found for: VALPROATE No components found for:  CBMZ  Current Medications: Current Outpatient Medications  Medication Sig Dispense Refill   amLODipine (NORVASC) 10 MG tablet Take 1 tablet (10 mg total) by mouth daily. 30 tablet 0   docusate sodium (COLACE) 100 MG capsule Take 1 capsule (100 mg total) by mouth every 12 (twelve) hours. (Patient not taking: Reported on 05/18/2015) 60 capsule 0   escitalopram (LEXAPRO) 10 MG tablet Take 1 tablet (10 mg total) by mouth daily for 90 doses. 30 tablet 1   hydrochlorothiazide (HYDRODIURIL) 25 MG tablet Take 1 tablet (25 mg total) by mouth daily. 30 tablet 0   ondansetron (ZOFRAN ODT) 4 MG disintegrating tablet Take 1 tablet (4 mg total) by mouth every 8 (eight) hours as needed for nausea. (Patient not taking: Reported on 08/02/2016) 10 tablet 0   oxyCODONE-acetaminophen (PERCOCET) 10-325 MG tablet Take 1 tablet by mouth every 6 (six) hours.  sildenafil (VIAGRA) 100 MG tablet sildenafil 100 mg tablet  TAKE ONE TABLET BY MOUTH AS NEEDED ONCE DAILY FOR 30 DAYS.     traZODone (DESYREL) 50 MG tablet Take 1 tablet (50 mg total) by mouth at bedtime and may repeat dose one time if needed. 60 tablet 1   No current facility-administered medications for this visit.     Musculoskeletal: Strength & Muscle Tone: Unable to assess due to telemedicine visit Gait & Station: Unable to assess due to telemedicine visit Patient leans: Unable to assess due to telemedicine visit  Psychiatric Specialty Exam: Review of Systems  Psychiatric/Behavioral:  Positive for agitation, decreased concentration and sleep disturbance. Negative for dysphoric mood, hallucinations, self-injury and suicidal ideas. The patient is nervous/anxious. The patient is not hyperactive.    There were no vitals taken for this visit.There is no height or weight on file to calculate BMI.  General Appearance: Unable to assess due to telemedicine visit  Eye Contact:  Unable to assess due to  telemedicine visit  Speech:  Clear and Coherent and Normal Rate  Volume:  Increased  Mood:  Anxious, Depressed, and Irritable  Affect:  Congruent and Full Range  Thought Process:  Coherent, Goal Directed, and Descriptions of Associations: Intact  Orientation:  Full (Time, Place, and Person)  Thought Content: WDL, Rumination, and Tangential   Suicidal Thoughts:  No  Homicidal Thoughts:  No  Memory:  Immediate;   Good Recent;   Good Remote;   Good  Judgement:  Fair  Insight:  Fair  Psychomotor Activity:  Restlessness  Concentration:  Concentration: Good and Attention Span: Fair  Recall:  Good  Fund of Knowledge: Good  Language: Good  Akathisia:  NA  Handed:  Right  AIMS (if indicated): not done  Assets:  Communication Skills Desire for Improvement Housing Intimacy Social Support Transportation Vocational/Educational  ADL's:  Intact  Cognition: WNL  Sleep:  Fair   Screenings: GAD-7    Flowsheet Row Video Visit from 03/15/2021 in [facility] Video Visit from 12/22/2020 in [facility]  Total GAD-7 Score 11 8      PHQ2-9    Flowsheet Row Video Visit from 03/15/2021 in [facility] Video Visit from 12/22/2020 in [facility] Office Visit from 08/24/2020 in [facility]  PHQ-2 Total Score 2 0 0  PHQ-9 Total Score 5 -- --      Flowsheet Row Video Visit from 03/15/2021 in [facility] Video Visit from 12/22/2020 in [facility] Office Visit from 08/24/2020 in [facility]  C-SSRS RISK CATEGORY Low Risk No Risk No Risk        Assessment and Plan:   Eugene Franco is a 45 year old male with a past psychiatric history significant for alcohol use disorder, anxiety, and insomnia who presents to [facility] via virtual  telephone visit for follow-up and medication management.  Patient expresses that he is in need of his escitalopram medication due to being unable to function properly without it.  Patient endorses depressive symptoms and anxiety but states that his medications have been helpful.  Patient's medications to be e-prescribed to pharmacy of choice.  Provider informed patient to establish care with a primary care provider so that he is able to continue receiving his hypertension medication and fluid pills.  Patient vocalized understanding.  1. Anxiety state  - escitalopram (LEXAPRO) 10  MG tablet; Take 1 tablet (10 mg total) by mouth daily for 90 doses.  Dispense: 30 tablet; Refill: 1  2. Primary hypertension  - hydrochlorothiazide (HYDRODIURIL) 25 MG tablet; Take 1 tablet (25 mg total) by mouth daily.  Dispense: 30 tablet; Refill: 0 - amLODipine (NORVASC) 10 MG tablet; Take 1 tablet (10 mg total) by mouth daily.  Dispense: 30 tablet; Refill: 0  3. Insomnia, unspecified type  - traZODone (DESYREL) 50 MG tablet; Take 1 tablet (50 mg total) by mouth at bedtime and may repeat dose one time if needed.  Dispense: 60 tablet; Refill: 1  Patient to follow up in 6 weeks Provider spent a total of 30 minutes with the patient/reviewing the patient's chart  Meta Hatchet, PA 03/15/2021, 11:31 PM

## 2021-04-26 ENCOUNTER — Telehealth (HOSPITAL_COMMUNITY): Payer: No Payment, Other | Admitting: Physician Assistant

## 2021-06-12 ENCOUNTER — Encounter (HOSPITAL_COMMUNITY): Payer: Self-pay | Admitting: Physician Assistant

## 2021-06-12 ENCOUNTER — Telehealth (INDEPENDENT_AMBULATORY_CARE_PROVIDER_SITE_OTHER): Payer: No Payment, Other | Admitting: Physician Assistant

## 2021-06-12 DIAGNOSIS — G4700 Insomnia, unspecified: Secondary | ICD-10-CM

## 2021-06-12 DIAGNOSIS — F411 Generalized anxiety disorder: Secondary | ICD-10-CM | POA: Diagnosis not present

## 2021-06-12 NOTE — Progress Notes (Signed)
BH MD/PA/NP OP Progress Note  Virtual Visit via Telephone Note  I connected with Eugene Franco on 06/12/21 at  3:30 PM EST by telephone and verified that I am speaking with the correct person using two identifiers.  Location: Patient: Home Provider: Clinic   I discussed the limitations, risks, security and privacy concerns of performing an evaluation and management service by telephone and the availability of in person appointments. I also discussed with the patient that there may be a patient responsible charge related to this service. The patient expressed understanding and agreed to proceed.  Follow Up Instructions:  I discussed the assessment and treatment plan with the patient. The patient was provided an opportunity to ask questions and all were answered. The patient agreed with the plan and demonstrated an understanding of the instructions.   The patient was advised to call back or seek an in-person evaluation if the symptoms worsen or if the condition fails to improve as anticipated.  I provided 13 minutes of non-face-to-face time during this encounter.  Meta Hatchet, PA    06/12/2021 10:09 PM Eugene Franco  MRN:  [id number]  Chief Complaint: Follow up and medication management  HPI:   Eugene Franco is a 45 year old male with a past psychiatric history significant for alcohol use disorder, anxiety, and insomnia who presents to [facility] via virtual telephone visit for follow-up and medication management.  Patient is currently being managed on the following medications:  Escitalopram 10 mg daily Trazodone 50 mg at bedtime  Patient reports no issues or concerns regarding his current medication regimen.  Patient denies the need for dosage adjustments at this time and is requesting no refills following the conclusion of the encounter.  Patient denies experiencing any depressive episodes at this time.  He does endorse some anxiety but  states that it has been manageable as of late.  Patient rates his current anxiety a 3-4 out of 10.  Patient's main stressor includes some worry over his brother recently being involved in an automobile accident.  Patient states that his brother is expected to make a full recovery from the accident.  A GAD-7 screen was performed with the patient scoring a 4.  Patient is alert and oriented x4, calm, cooperative, and fully engaged in conversation during the encounter.  Patient endorses being in a pretty good mood.  Patient denies suicidal or homicidal ideations.  He further denies auditory or visual hallucinations and does not appear to be responding to internal/external stimuli.  Patient endorses fair sleep and receives on average 5 to 6 hours of sleep each night.  Patient endorses good appetite and eats on average 2-3 meals per day.  Patient endorses alcohol consumption occasionally.  Patient endorses tobacco use and smokes roughly 1 to 2 cigarettes/day.  Patient denies illicit drug use.  Visit Diagnosis:    ICD-10-CM   1. Insomnia, unspecified type  G47.00     2. Anxiety state  F41.1       Past Psychiatric History:  Anxiety Insomnia  Past Medical History:  Past Medical History:  Diagnosis Date   Arthritis    GERD (gastroesophageal reflux disease)    Hypertension    History reviewed. No pertinent surgical history.  Family Psychiatric History:  None reported  Family History:  Family History  Problem Relation Age of Onset   Arthritis Mother    Arthritis Father    Cancer Sister        breast   Cancer Sister  Arthritis Maternal Grandmother    Hypertension Maternal Grandmother    Arthritis Maternal Grandfather    Hypertension Maternal Grandfather     Social History:  Social History   Socioeconomic History   Marital status: Married    Spouse name: Not on file   Number of children: Not on file   Years of education: Not on file   Highest education level: Not on file   Occupational History   Not on file  Tobacco Use   Smoking status: Every Day    Packs/day: 0.25    Types: Cigarettes   Smokeless tobacco: Never  Substance and Sexual Activity   Alcohol use: Yes   Drug use: Yes    Types: Marijuana   Sexual activity: Not on file  Other Topics Concern   Not on file  Social History Narrative   Not on file   Social Determinants of Health   Financial Resource Strain: Not on file  Food Insecurity: Not on file  Transportation Needs: Not on file  Physical Activity: Not on file  Stress: Not on file  Social Connections: Not on file    Allergies: No Known Allergies  Metabolic Disorder Labs: No results found for: HGBA1C, MPG No results found for: PROLACTIN Lab Results  Component Value Date   CHOL 205 (H) 11/22/2010   TRIG 95.0 11/22/2010   HDL 67.10 11/22/2010   CHOLHDL 3 11/22/2010   VLDL 19.0 11/22/2010   No results found for: TSH  Therapeutic Level Labs: No results found for: LITHIUM No results found for: VALPROATE No components found for:  CBMZ  Current Medications: Current Outpatient Medications  Medication Sig Dispense Refill   amLODipine (NORVASC) 10 MG tablet Take 1 tablet (10 mg total) by mouth daily. 30 tablet 0   docusate sodium (COLACE) 100 MG capsule Take 1 capsule (100 mg total) by mouth every 12 (twelve) hours. (Patient not taking: Reported on 05/18/2015) 60 capsule 0   escitalopram (LEXAPRO) 10 MG tablet Take 1 tablet (10 mg total) by mouth daily for 90 doses. 30 tablet 1   hydrochlorothiazide (HYDRODIURIL) 25 MG tablet Take 1 tablet (25 mg total) by mouth daily. 30 tablet 0   ondansetron (ZOFRAN ODT) 4 MG disintegrating tablet Take 1 tablet (4 mg total) by mouth every 8 (eight) hours as needed for nausea. (Patient not taking: Reported on 08/02/2016) 10 tablet 0   oxyCODONE-acetaminophen (PERCOCET) 10-325 MG tablet Take 1 tablet by mouth every 6 (six) hours.     sildenafil (VIAGRA) 100 MG tablet sildenafil 100 mg tablet   TAKE ONE TABLET BY MOUTH AS NEEDED ONCE DAILY FOR 30 DAYS.     traZODone (DESYREL) 50 MG tablet Take 1 tablet (50 mg total) by mouth at bedtime and may repeat dose one time if needed. 60 tablet 1   No current facility-administered medications for this visit.     Musculoskeletal: Strength & Muscle Tone: Unable to assess due to telemedicine visit Gait & Station: Unable to assess due to telemedicine visit Patient leans: Unable to assess due to telemedicine visit  Psychiatric Specialty Exam: Review of Systems  Psychiatric/Behavioral:  Positive for sleep disturbance. Negative for decreased concentration, dysphoric mood, hallucinations, self-injury and suicidal ideas. The patient is nervous/anxious. The patient is not hyperactive.    There were no vitals taken for this visit.There is no height or weight on file to calculate BMI.  General Appearance: Unable to assess due to telemedicine visit  Eye Contact:  Unable to assess due to telemedicine visit  Speech:  Clear and Coherent and Normal Rate  Volume:  Normal  Mood:  Anxious and Euthymic  Affect:  Appropriate and Congruent  Thought Process:  Coherent and Descriptions of Associations: Intact  Orientation:  Full (Time, Place, and Person)  Thought Content: WDL   Suicidal Thoughts:  No  Homicidal Thoughts:  No  Memory:  Immediate;   Good Recent;   Good Remote;   Good  Judgement:  Good  Insight:  Fair  Psychomotor Activity:  Restlessness  Concentration:  Concentration: Good and Attention Span: Good  Recall:  Good  Fund of Knowledge: Good  Language: Good  Akathisia:  NA  Handed:  Right  AIMS (if indicated): not done  Assets:  Communication Skills Desire for Improvement Housing Intimacy Social Support Transportation Vocational/Educational  ADL's:  Intact  Cognition: WNL  Sleep:  Fair   Screenings: GAD-7    Flowsheet Row Video Visit from 06/12/2021 in [facility] Video Visit from 03/15/2021 in  [facility] Video Visit from 12/22/2020 in [facility]  Total GAD-7 Score 4 11 8       PHQ2-9    Flowsheet Row Video Visit from 06/12/2021 in [facility] Video Visit from 03/15/2021 in [facility] Video Visit from 12/22/2020 in [facility] Visit from 08/24/2020 in [facility]  PHQ-2 Total Score 1 2 0 0  PHQ-9 Total Score -- 5 -- --      Flowsheet Row Video Visit from 06/12/2021 in [facility] Video Visit from 03/15/2021 in [facility] Video Visit from 12/22/2020 in [facility]  C-SSRS RISK CATEGORY Low Risk Low Risk No Risk        Assessment and Plan:   Eugene Franco is a 45 year old male with a past psychiatric history significant for alcohol use disorder, anxiety, and insomnia who presents to [facility] via virtual telephone visit for follow-up and medication management.  Patient reports no issues or concerns regarding his current medication regimen.  Patient denies the need for dosage adjustments at this time.  Patient to continue taking medications as prescribed.  1. Insomnia, unspecified type Patient to continue taking trazodone 50 mg at bedtime for the management of his insomnia  2. Anxiety state Patient to continue taking escitalopram 10 mg daily for the management of his anxiety  Patient to follow up in 2 months Provider spent a total of 13 minutes with the patient/reviewing the patient's chart  [facility], PA 06/12/2021, 10:09 PM

## 2021-07-04 ENCOUNTER — Emergency Department (HOSPITAL_COMMUNITY)
Admission: EM | Admit: 2021-07-04 | Discharge: 2021-07-04 | Disposition: A | Discharge status: Home / Self Care | Payer: 59 | Attending: Emergency Medicine | Admitting: Emergency Medicine

## 2021-07-04 ENCOUNTER — Other Ambulatory Visit: Payer: Self-pay

## 2021-07-04 ENCOUNTER — Encounter (HOSPITAL_COMMUNITY): Payer: Self-pay | Admitting: Emergency Medicine

## 2021-07-04 ENCOUNTER — Emergency Department (HOSPITAL_COMMUNITY): Payer: 59

## 2021-07-04 DIAGNOSIS — G64 Other disorders of peripheral nervous system: Secondary | ICD-10-CM | POA: Insufficient documentation

## 2021-07-04 DIAGNOSIS — R200 Anesthesia of skin: Secondary | ICD-10-CM | POA: Diagnosis present

## 2021-07-04 DIAGNOSIS — G629 Polyneuropathy, unspecified: Secondary | ICD-10-CM | POA: Insufficient documentation

## 2021-07-04 MED ORDER — GADOBUTROL 1 MMOL/ML IV SOLN
8.0000 mL | Freq: Once | INTRAVENOUS | Status: AC | PRN
  Administered 2021-07-04: 8 mL via INTRAVENOUS

## 2021-07-04 NOTE — ED Provider Triage Note (Signed)
Emergency Medicine Provider Triage Evaluation Note  Eugene Franco , a 46 y.o. male  was evaluated in triage.  Pt complains of left arm numbness.  He states that same began when he woke up at 2 AM on Monday.  States that he spent the night sleeping on the floor with his head on his left arm.  States that since then he has had numbness in his left hand and some decreased sensation in his left forearm as well.  Of note, he also states that he has been out of his blood pressure medication for 4 days.  He denies any headache, fevers, or chills.  Review of Systems  Positive:  Negative: See above  Physical Exam  BP (!) 165/93 (BP Location: Right Arm)    Pulse (!) 101    Temp 98.4 F (36.9 C) (Oral)    Resp 16    Ht 5' 11.5" (1.816 m)    Wt 86.2 kg    SpO2 97%    BMI 26.13 kg/m  Gen:   Awake, no distress   Resp:  Normal effort  MSK:   Moves extremities without difficulty  Other:  decreased sensation to the dorsal aspect of the left arm extending up into the left shoulder. 1/5 strength noted to the left hand. 4/5 strength noted to the left elbow and shoulder.  Medical Decision Making  Medically screening exam initiated at 10:10 AM.  Appropriate orders placed.  Eugene Franco was informed that the remainder of the evaluation will be completed by another provider, this initial triage assessment does not replace that evaluation, and the importance of remaining in the ED until their evaluation is complete.     Eugene Bandy, PA-C 07/04/21 1035

## 2021-07-04 NOTE — ED Triage Notes (Signed)
Complains of L arm numbness, decreased sensation and movement since Monday at 2:00am, decreased sensation at elbow and below, denies pain. Has been w/o HTN medications x4 days.

## 2021-07-04 NOTE — ED Provider Notes (Signed)
[facility]-EMERGENCY DEPT Provider Note   CSN: [id number] Arrival date & time: 07/04/21  8832     History  Chief Complaint  Patient presents with   arm numbness    Eugene Franco is a 46 y.o. male.  HPI    46 year old male comes in with chief complaint of arm numbness and weakness.  Patient has no significant medical history.  He denies any heavy smoking, illicit drug use or heavy alcohol use.  He reports that he woke up this morning after sleeping on the floor with the left upper extremity numbness.  The numbness is profound over his forearm below the elbow.  He is also unable to open his palm completely.   Patient denies any focal numbness, weakness elsewhere and denies any slurred speech, change in vision.  No history of stroke.  Patient denies any headache or neck pain.  Home Medications Prior to Admission medications   Medication Sig Start Date End Date Taking? Authorizing Provider  amLODipine (NORVASC) 10 MG tablet Take 1 tablet (10 mg total) by mouth daily. 03/15/21   Nwoko, Tommas Olp, PA  docusate sodium (COLACE) 100 MG capsule Take 1 capsule (100 mg total) by mouth every 12 (twelve) hours. Patient not taking: Reported on 05/18/2015 02/08/13   Rolland Porter, MD  escitalopram (LEXAPRO) 10 MG tablet Take 1 tablet (10 mg total) by mouth daily for 90 doses. 03/15/21 06/13/21  Nwoko, Tommas Olp, PA  hydrochlorothiazide (HYDRODIURIL) 25 MG tablet Take 1 tablet (25 mg total) by mouth daily. 03/15/21   Nwoko, Tommas Olp, PA  ondansetron (ZOFRAN ODT) 4 MG disintegrating tablet Take 1 tablet (4 mg total) by mouth every 8 (eight) hours as needed for nausea. Patient not taking: Reported on 08/02/2016 07/30/16   Garlon Hatchet, PA-C  oxyCODONE-acetaminophen (PERCOCET) 10-325 MG tablet Take 1 tablet by mouth every 6 (six) hours. 08/29/20   [provider]  sildenafil (VIAGRA) 100 MG tablet sildenafil 100 mg tablet  TAKE ONE TABLET BY MOUTH AS NEEDED ONCE DAILY FOR 30  DAYS.    [provider]  traZODone (DESYREL) 50 MG tablet Take 1 tablet (50 mg total) by mouth at bedtime and may repeat dose one time if needed. 03/15/21   Meta Hatchet, PA      Allergies    Patient has no known allergies.    Review of Systems   Review of Systems  Constitutional:  Positive for activity change.  Neurological:  Positive for weakness and numbness.   Physical Exam Updated Vital Signs BP 124/87 (BP Location: Right Arm)    Pulse 98    Temp 98.4 F (36.9 C) (Oral)    Resp 18    Ht 5' 11.5" (1.816 m)    Wt 86.2 kg    SpO2 98%    BMI 26.13 kg/m  Physical Exam Vitals and nursing note reviewed.  Constitutional:      Appearance: He is well-developed.  HENT:     Head: Atraumatic.  Neck:     Comments: No midline c-spine tenderness, pt able to turn head to 45 degrees bilaterally without any pain and able to flex neck to the chest and extend without any pain or neurologic symptoms.  Cardiovascular:     Rate and Rhythm: Normal rate.  Pulmonary:     Effort: Pulmonary effort is normal.  Musculoskeletal:     Cervical back: Neck supple.  Skin:    General: Skin is warm.  Neurological:     Mental  Status: He is alert and oriented to person, place, and time.     Cranial Nerves: No cranial nerve deficit.     Sensory: Sensory deficit present.     Motor: Weakness present.     Comments: Left upper extremity exam:  Patient able to abduct however the left upper extremity has mild weakness against resistance. Patient has subjective numbness over the left arm, numbness gets worse distally. Patient's hand is in clawhand position.  Patient has difficulty with opposition of the thumb and supination of the left forearm. Hyporeflexia at the bicipital groove    ED Results / Procedures / Treatments   Labs (all labs ordered are listed, but only abnormal results are displayed) Labs Reviewed - No data to display  EKG None  Radiology MR Brain W and Wo Contrast  Result  Date: 07/04/2021 CLINICAL DATA:  Neuro deficit, acute, stroke suspected L sided upper extremity weakness, numbness EXAM: MRI HEAD WITHOUT AND WITH CONTRAST TECHNIQUE: Multiplanar, multiecho pulse sequences of the brain and surrounding structures were obtained without and with intravenous contrast. CONTRAST:  70mL GADAVIST GADOBUTROL 1 MMOL/ML IV SOLN COMPARISON:  None. FINDINGS: Mildly motion limited study. Brain: No acute infarction, hemorrhage, hydrocephalus, extra-axial collection or mass lesion. No abnormal enhancement. Vascular: Major arterial flow voids are maintained skull base. Skull and upper cervical spine: Normal marrow signal. Sinuses/Orbits: No significant paranasal sinus disease. Unremarkable orbits. Other: No sizable mastoid effusions IMPRESSION: No evidence of acute intracranial abnormality. Electronically Signed   By: Feliberto Harts M.D.   On: 07/04/2021 13:34   MR Cervical Spine W or Wo Contrast  Result Date: 07/04/2021 CLINICAL DATA:  46 year old male with left upper extremity numbness and weakness for several days. EXAM: MRI CERVICAL SPINE WITHOUT AND WITH CONTRAST TECHNIQUE: Multiplanar and multiecho pulse sequences of the cervical spine, to include the craniocervical junction and cervicothoracic junction, were obtained without and with intravenous contrast. CONTRAST:  110mL GADAVIST GADOBUTROL 1 MMOL/ML IV SOLN COMPARISON:  None. FINDINGS: Alignment: Straightening of cervical lordosis. Subtle retrolisthesis of C3 on C4. Vertebrae: Advanced chronic degenerative endplate marrow signal changes C5 through C7. Superimposed mild degenerative endplate marrow edema and enhancement at C3-C4. Background bone marrow signal within normal limits. Cord: No spinal cord signal abnormality despite some degenerative cord mass effect detailed below. No abnormal intradural enhancement. No dural thickening. Posterior Fossa, vertebral arteries, paraspinal tissues: Cervicomedullary junction is within normal limits.  Negative visible posterior fossa. Preserved major vascular flow voids in the neck. Negative visible neck soft tissues the and right lung apex. Disc levels: C2-C3:  Negative. C3-C4: Mild disc space loss and circumferential disc bulge. Superimposed central posterior disc protrusion with annular fissure (series 8, image 10). Mild endplate spurring. Mild spinal stenosis and ventral cord mass effect. Mild facet hypertrophy. Mild to moderate bilateral C4 foraminal stenosis. C4-C5: Disc space loss with mildly lobulated circumferential disc osteophyte complex eccentric to the right. Mild spinal stenosis. Mild if any cord mass effect. Moderate left and moderate to severe right C5 foraminal stenosis. C5-C6: Disc space loss with mild circumferential disc osteophyte complex. No spinal stenosis. Moderate right C6 foraminal stenosis. C6-C7: Disc space loss with mild circumferential disc osteophyte complex. No spinal stenosis. Mild left but severe right C7 neural foraminal stenosis. C7-T1:  Mild facet hypertrophy. No stenosis. Negative visible upper thoracic spine. IMPRESSION: 1. Age advanced cervical disc and endplate degeneration. Small central disc herniation at C3-C4. Mild spinal stenosis and spinal cord mass effect at that level. Mild spinal stenosis at C4-C5. No associated  2.  Cord signal abnormality. 3. Severe degenerative neural foraminal stenosis at the right C5 and C7 nerve levels. Up to moderate foraminal stenosis at the bilateral C4, left C5, and right C6 nerve levels. Electronically Signed   By: Odessa FlemingH  Hall M.D.   On: 07/04/2021 13:24    Procedures Procedures    Medications Ordered in ED Medications  gadobutrol (GADAVIST) 1 MMOL/ML injection 8 mL (8 mLs Intravenous Contrast Given 07/04/21 1312)    ED Course/ Medical Decision Making/ A&P                           Medical Decision Making  46 year old male comes in with chief complaint of left sided numbness and weakness in the upper extremity.  There is no  associated pain.  Differential diagnosis considered for this patient includes LVO stroke, MS, cerebral stroke, GBS, impingement syndrome of the peripheral nerve, peripheral neuropathy/nerve palsy.  Patient has no risk factors for stroke.  Clinically, not feeling the LVO criteria. I Ordered MRI brain with and without contrast, MRI spine with and without contrast, and the results are negative for any acute stroke.  At this time, is demonstrating symptoms consistent with peripheral neuropathy, although he is not demonstrating a classic upper extremity nerve syndrome related to median, ulnar or radial nerve in isolation.  Plexopathy is also possible.  At this time, will advise patient to follow-up with the neurology for outpatient EMG.  We have placed him in a volar splint which will allow for more functional position of the phalanges.  Stable for discharge.         Final Clinical Impression(s) / ED Diagnoses Final diagnoses:  Neuropathy  Peripheral nerve dysfunction    Rx / DC Orders ED Discharge Orders          Ordered    Ambulatory referral to Neurology       Comments: An appointment is requested in approximately: 2 weeks   07/04/21 1435              Derwood KaplanNanavati, Charlita Brian, MD 07/04/21 1535

## 2021-07-04 NOTE — ED Notes (Signed)
An After Visit Summary was printed and given to the patient. Discharge instructions given and no further questions at this time.  

## 2021-07-04 NOTE — Discharge Instructions (Signed)
You are seen in the ER for left-sided weakness.  MRI of your brain and neck is reassuring.  No evidence of stroke.  You do have a lot of arthritic changes to your neck, but we do not see any spinal cord disease.  At this time we suspect that you have disease of the nerve.  It is unclear what the pathology is.  To get better diagnostic work-up, you will have to see a neurologist and the referral has been provided.  Please call to set up an appointment with the neurologist in 2 weeks.

## 2021-07-09 ENCOUNTER — Encounter: Payer: Self-pay | Admitting: Neurology

## 2021-07-09 ENCOUNTER — Ambulatory Visit (INDEPENDENT_AMBULATORY_CARE_PROVIDER_SITE_OTHER): Payer: 59 | Admitting: Neurology

## 2021-07-09 VITALS — BP 119/74 | HR 80 | Ht 71.5 in | Wt 201.0 lb

## 2021-07-09 DIAGNOSIS — R200 Anesthesia of skin: Secondary | ICD-10-CM

## 2021-07-09 DIAGNOSIS — S4992XA Unspecified injury of left shoulder and upper arm, initial encounter: Secondary | ICD-10-CM

## 2021-07-09 DIAGNOSIS — M542 Cervicalgia: Secondary | ICD-10-CM

## 2021-07-09 DIAGNOSIS — G8929 Other chronic pain: Secondary | ICD-10-CM

## 2021-07-09 DIAGNOSIS — M4802 Spinal stenosis, cervical region: Secondary | ICD-10-CM

## 2021-07-09 DIAGNOSIS — M5412 Radiculopathy, cervical region: Secondary | ICD-10-CM | POA: Diagnosis not present

## 2021-07-09 DIAGNOSIS — R29898 Other symptoms and signs involving the musculoskeletal system: Secondary | ICD-10-CM | POA: Diagnosis not present

## 2021-07-09 NOTE — Progress Notes (Signed)
[facility]    Provider:  Dr Jaynee Eagles Requesting Provider: Varney Biles, MD Primary Care Provider:  Holland Commons, FNP  CC: Arm numbness, weakness, acute, MRI brain negative no stroke, significant cervical degenerative disease  HPI:  Eugene Franco is a 46 y.o. male here as requested by emergency room for a chief complaint of arm numbness and weakness.  I reviewed Dr. Andree Elk notes: Patient was seen in the emergency room several days ago, chief complaint arm numbness and weakness, woke up that morning after sleeping on the floor with the left upper extremity numbness, the numbness is profound over his forearm below the elbow, he is also unable to open his palm completely, he denied heavy smoking illicit drug use or heavy alcohol use, he denied any focal weakness, numbness elsewhere or slurred speech.  Neurologic exam showed he was able to AB duct however the left upper extremity has mild weakness against resistance, and subjective numbness over the left arm, numbness gets worse distally, the hand is in a clawed position, difficulty with opposition of the thumb and supination of the left forearm, hyporeflexia at the bicipital groove.  They put him in a volar splint to help with more functional positioning of the phalanges.  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 Mostly in the hands. He has neck pain and radicular symptoms all the ay down the arm. Right arm is fine. He has chronic neck pain. Orthosis hon the hand helps. Nothing makes it worse except neck pain. He loads trucks. He had an MRI cevrical spine with considerable arthrits and nerve impingement.   Reviewed notes, labs and imaging from outside physicians, which showed:  MRI brain 07/04/2020: FINDINGS: Mildly  motion limited study.   Brain: No acute infarction, hemorrhage, hydrocephalus, extra-axial collection or mass lesion. No abnormal enhancement.   Vascular: Major arterial flow voids are maintained skull base.   Skull and upper cervical spine: Normal marrow signal.   Sinuses/Orbits: No significant paranasal sinus disease. Unremarkable orbits.   Other: No sizable mastoid effusions     IMPRESSION:1. Age advanced cervical disc and endplate degeneration. Small central disc herniation at C3-C4. Mild spinal stenosis and spinal cord mass effect at that level. Mild spinal stenosis at C4-C5. No associated 2.  Cord signal abnormality. 3. Severe degenerative neural foraminal stenosis at the right C5 and C7 nerve levels. Up to moderate foraminal stenosis at the bilateral C4, left C5, and right C6 nerve levels. No evidence of acute intracranial abnormality.    Review of Systems: Patient complains of symptoms per HPI as well as the following symptoms arm weakness. Pertinent negatives and positives per HPI. All others negative.   Social History   Socioeconomic History   Marital status: Married    Spouse name: Not on file   Number of children: Not on file   Years of education: Not on file   Highest education level: Not on file  Occupational History   Not on file  Tobacco Use   Smoking status: Every Day    Packs/day: 0.10    Types: Cigarettes   Smokeless tobacco: Never  Substance and Sexual Activity   Alcohol use: Yes    Comment: case of beer per week and between of 1/5 and a pint   Drug use: Not Currently  Types: Marijuana   Sexual activity: Not on file  Other Topics Concern   Not on file  Social History Narrative   Lives at home with wife, two sons, and niece    Right handed   Caffeine: 1 cup/day   Social Determinants of Health   Financial Resource Strain: Not on file  Food Insecurity: Not on file  Transportation Needs: Not on file  Physical Activity: Not on file   Stress: Not on file  Social Connections: Not on file  Intimate Partner Violence: Not on file    Family History  Problem Relation Age of Onset   Arthritis Mother    Arthritis Father    Cancer Sister        breast   Cancer Sister    Arthritis Maternal Grandmother    Hypertension Maternal Grandmother    Arthritis Maternal Grandfather    Hypertension Maternal Grandfather     Past Medical History:  Diagnosis Date   Arthritis    GERD (gastroesophageal reflux disease)    Hypertension     Patient Active Problem List   Diagnosis Date Noted   Cervical radiculopathy 07/09/2021   Insomnia 03/15/2021   Anxiety state 03/15/2021   Alcohol use disorder, severe, dependence ([place]) 08/30/2020   Thrombosed external hemorrhoid 02/10/2013   Hypertension 12/30/2010   Alcohol ingestion of one to four drinks per day 12/30/2010   Tobacco use disorder 12/30/2010   Back pain 12/30/2010   Annual physical exam 12/02/2010    Past Surgical History:  Procedure Laterality Date   WRIST FRACTURE SURGERY Right    left wrist broken as well but no surgery as of yet; [facility]    Current Outpatient Medications  Medication Sig Dispense Refill   amLODipine (NORVASC) 10 MG tablet Take 1 tablet (10 mg total) by mouth daily. 30 tablet 0   docusate sodium (COLACE) 100 MG capsule Take 1 capsule (100 mg total) by mouth every 12 (twelve) hours. 60 capsule 0   escitalopram (LEXAPRO) 10 MG tablet Take 1 tablet (10 mg total) by mouth daily for 90 doses. 30 tablet 1   hydrochlorothiazide (HYDRODIURIL) 25 MG tablet Take 1 tablet (25 mg total) by mouth daily. 30 tablet 0   ondansetron (ZOFRAN ODT) 4 MG disintegrating tablet Take 1 tablet (4 mg total) by mouth every 8 (eight) hours as needed for nausea. 10 tablet 0   oxyCODONE-acetaminophen (PERCOCET) 10-325 MG tablet Take 1 tablet by mouth every 6 (six) hours.     sildenafil (VIAGRA) 100 MG tablet sildenafil 100 mg tablet  TAKE ONE TABLET BY MOUTH AS NEEDED ONCE  DAILY FOR 30 DAYS.     traZODone (DESYREL) 50 MG tablet Take 1 tablet (50 mg total) by mouth at bedtime and may repeat dose one time if needed. 60 tablet 1   No current facility-administered medications for this visit.    Allergies as of 07/09/2021   (No Known Allergies)    Vitals: BP 119/74 (BP Location: Right Arm, Patient Position: Sitting)    Pulse 80    Ht 5' 11.5" (1.816 m)    Wt 201 lb (91.2 kg)    BMI 27.64 kg/m  Last Weight:  Wt Readings from Last 1 Encounters:  07/09/21 201 lb (91.2 kg)   Last Height:   Ht Readings from Last 1 Encounters:  07/09/21 5' 11.5" (1.816 m)     Physical exam: Exam: Gen: NAD, conversant, well nourised, well groomed  CV: RRR, no MRG. No Carotid Bruits. No peripheral edema, warm, nontender Eyes: Conjunctivae clear without exudates or hemorrhage  Neuro: Detailed Neurologic Exam  Speech:    Speech is normal; fluent and spontaneous with normal comprehension.  Cognition:    The patient is oriented to person, place, and time;     recent and remote memory intact;     language fluent;     normal attention, concentration,     fund of knowledge Cranial Nerves:    The pupils are equal, round, and reactive to light. The fundi are flat. Visual fields are full to finger confrontation. Extraocular movements are intact. Trigeminal sensation is intact and the muscles of mastication are normal. The face is symmetric. The palate elevates in the midline. Hearing intact. Voice is normal. Shoulder shrug is normal. The tongue has normal motion without fasciculations.   Coordination:    Normal   Gait:     normal.   Motor Observation:    No asymmetry, no atrophy, and no involuntary movements noted. Tone:    Normal muscle tone.    Posture:    Posture is normal. normal erect    Strength:  Right deltoid 4/5 Right biceps 4+ Right triceps 3+ Weak grip, weak interossei, difficult to assess wrist extention had an injury(had broken  wrist and residual weakness extension wrist and figers) Otherwise right arm strength is V/V in the upper and lower limbs.      Sensation: sensory changes in digits 1-4     Reflex Exam:  DTR's:    Deep tendon reflexes in the upper and lower extremities are about 1+ bilaterally.   Toes:    The toes are equiv  bilaterally.   Clonus:    Clonus is absent.    Assessment/Plan:  Patient with chronic neck pain, significant Age advanced cervical disc and endplate degeneration and multi-level foraminal and central stenosis. Needs emg/ncs for peripheral nerve entrapment(sleep on the group) vs cervical causes and need for surgical intervention.   -Emg/Ncs: left arm. Schedued in [facility], If surgeon sees him within 2 weeks and thinks emg is not necessary can cacnel otherwise will move it up (in march right now). Alternatvely f improved with Pt, can also defer testing.  -Send to [facility] for evaluation of cervical radiculopahy and right arm weakness and numbness, has significant Age advanced cervical disc and endplate degeneration and multi-level foraminal and central stenosis.  Orders Placed This Encounter  Procedures   Ambulatory referral to Neurosurgery   Ambulatory referral to Physical Therapy   NCV with EMG(electromyography)   No orders of the defined types were placed in this encounter.   Cc: Varney Biles, MD,  Holland Commons, [place], [facility] [address] [place], [place] [address]  Phone [phone] Fax [phone]

## 2021-07-09 NOTE — Patient Instructions (Addendum)
Emg/ncs Referral to neurosurgeon Physical therapy  Cervical Radiculopathy Cervical radiculopathy happens when a nerve in the neck (a cervical nerve) is pinched or bruised. This condition can happen because of an injury to the cervical spine (vertebrae) in the neck, or as part of the normal aging process. Pressure on the cervical nerves can cause pain or numbness that travels from the neck all the way down to the arm and fingers. This condition usually gets better with rest. Treatment may be needed if the condition does not improve. What are the causes? This condition may be caused by: A neck injury. A bulging (herniated) disk. Muscle spasms. Muscle tightness in the neck due to overuse. Arthritis. Breakdown or degeneration in the bones and joints of the spine (spondylosis) due to aging. Bone spurs that may develop near the cervical nerves. What are the signs or symptoms? Symptoms of this condition include: Pain. The pain may travel from the neck to the arm and hand. The pain can be severe or irritating. It may get worse when you move your neck. Numbness or tingling in your arm or hand. Weakness in the affected arm and hand, in severe cases. How is this diagnosed? This condition may be diagnosed based on your symptoms, your medical history, and a physical exam. You may also have tests, including: X-rays. CT scan. MRI. Electromyogram (EMG). Nerve conduction tests. How is this treated? In many cases, treatment is not needed for this condition. With rest, the condition usually gets better over time. If treatment is needed, options may include: Wearing a soft neck collar (cervical collar) for short periods of time. Doing physical therapy to strengthen your neck muscles. Taking medicines. These may include NSAIDs, such as ibuprofen, or oral corticosteroids. Having spinal injections, in severe cases. Having surgery. This may be needed if other treatments do not help. Different types of  surgery may be done depending on the cause of this condition. Follow these instructions at home: If you have a cervical collar: Wear it as told by your health care provider. Remove it only as told by your health care provider. Ask your health care provider if you can remove the cervical collar for cleaning and bathing. If you are allowed to remove the collar for cleaning or bathing: Follow instructions from your health care provider about how to remove the collar safely. Clean the collar by wiping it with mild soap and water and drying it completely. Take out any removable pads in the collar every 1-2 days, and wash them by hand with soap and water. Let them air-dry completely before you put them back in the collar. Check your skin under the collar for irritation or sores. If you see any, tell your health care provider. Managing pain   Take over-the-counter and prescription medicines only as told by your health care provider. If directed, put ice on the affected area. To do this: If you have a soft neck collar, remove it as told by your health care provider. Put ice in a plastic bag. Place a towel between your skin and the bag. Leave the ice on for 20 minutes, 2-3 times a day. Remove the ice if your skin turns bright red. This is very important. If you cannot feel pain, heat, or cold, you have a greater risk of damage to the area. If applying ice does not help, you can try using heat. Use the heat source that your health care provider recommends, such as a moist heat pack or a heating pad.  Place a towel between your skin and the heat source. Leave the heat on for 20-30 minutes. Remove the heat if your skin turns bright red. This is especially important if you are unable to feel pain, heat, or cold. You have a greater risk of getting burned. Try a gentle neck and shoulder massage to help relieve symptoms. Activity Rest as needed. Return to your normal activities as told by your health care  provider. Ask your health care provider what activities are safe for you. Do stretching and strengthening exercises as told by your health care provider or your physical therapist. You may have to avoid lifting. Ask your health care provider how much you can safely lift. General instructions Use a flat pillow when you sleep. Do not drive while wearing a cervical collar. If you do not have a cervical collar, ask your health care provider if it is safe to drive while your neck heals. Ask your health care provider if the medicine prescribed to you requires you to avoid driving or using machinery. Do not use any products that contain nicotine or tobacco. These products include cigarettes, chewing tobacco, and vaping devices, such as e-cigarettes. If you need help quitting, ask your health care provider. Keep all follow-up visits. This is important. Contact a health care provider if: Your condition does not improve with treatment. Get help right away if: Your pain gets much worse and is not controlled with medicines. You have weakness or numbness in your hand, arm, face, or leg. You have a high fever. You have a stiff, rigid neck. You lose control of your bowels or your bladder (have incontinence). You have trouble with walking, balance, or speaking. Summary Cervical radiculopathy happens when a nerve in the neck is pinched or bruised. A nerve can get pinched from a bulging disk, arthritis, muscle spasms, or an injury to the neck. Symptoms include pain, tingling, or numbness radiating from the neck to the arm or hand. Weakness can also occur in severe cases. Treatment may include rest, wearing a cervical collar, and physical therapy. Medicines may be prescribed to help with pain. In severe cases, injections or surgery may be needed. This information is not intended to replace advice given to you by your health care provider. Make sure you discuss any questions you have with your health care  provider. Document Revised: 12/21/2020 Document Reviewed: 12/21/2020 Elsevier Patient Education  2022 [employer] Electromyoneurogram Electromyoneurogram is a test to check how well your muscles and nerves are working. This procedure includes the combined use of electromyogram (EMG) and nerve conduction study (NCS). EMG is used to evaluate muscles and the nerves that control those muscles. NCS, which is also called electroneurogram, measures how well your nerves conduct electricity. The procedures should be done together to check if your muscles and nerves are healthy. If the results of the tests are abnormal, this may indicate disease or injury, such as a neuromuscular disease or peripheral nerve damage. Tell a health care provider about: Any allergies you have. All medicines you are taking, including vitamins, herbs, eye drops, creams, and over-the-counter medicines. Any bleeding problems you have. Any surgeries you have had. Any medical conditions you have. What are the risks? Generally, this is a safe procedure. However, problems may occur, including: Bleeding or bruising. Infection where the electrodes were inserted. What happens before the test? Medicines Take all of your usually prescribed medications before this testing is performed. Do not stop your blood thinners unless advised by your prescribing physician.  General instructions Your health care provider may ask you to warm the limb that will be checked with warm water, hot pack, or wrapping the limb in a blanket. Do not use lotions or creams on the same day that you will be having the procedure. What happens during the test? For EMG  Your health care provider will ask you to stay in a position so that the muscle being studied can be accessed. You will be sitting or lying down. You may be given a medicine to numb the area (local anesthetic) and the skin will be disinfected. A very thin needle that has an electrode will be  inserted into your muscle, one muscle at a time. Typically, multiple muscles are evaluated during a single study. Another small electrode will be placed on your skin near the muscle. Your health care provider will ask you to continue to remain still. The electrodes will record the electrical activity of your muscles. You may see this on a monitor or hear it in the room. After your muscles have been studied at rest, your health care provider will ask you to contract or flex your muscles. The electrodes will record the electrical activity of your muscles. Your health care provider will remove the electrodes and the electrode needle when the procedure is finished. The procedure may vary among health care providers and hospitals. For NCS  An electrode that records your nerve activity (recording electrode) will be placed on your skin by the muscle that is being studied. An electrode that is used as a reference (reference electrode) will be placed near the recording electrode. A paste or gel will be applied to your skin between the recording electrode and the reference electrode. Your nerve will be stimulated with a mild shock. The speed of the nerves and strength of response is recorded by the electrodes. Your health care provider will remove the electrodes and the gel when the procedure is finished. The procedure may vary among health care providers and hospitals. What can I expect after the test? It is up to you to get your test results. Ask your health care provider, or the department that is doing the test, when your results will be ready. Your health care provider may: Give you medicines for any pain. Monitor the insertion sites to make sure that bleeding stops. You should be able to drive yourself to and from the test. Discomfort can persist for a few hours after the test, but should be better the next day. Contact a health care provider if: You have swelling, redness, or drainage at any of  the insertion sites. Summary Electromyoneurogram is a test to check how well your muscles and nerves are working. If the results of the tests are abnormal, this may indicate disease or injury. This is a safe procedure. However, problems may occur, such as bleeding and infection. Your health care provider will do two tests to complete this procedure. One checks your muscles (EMG) and another checks your nerves (NCS). It is up to you to get your test results. Ask your health care provider, or the department that is doing the test, when your results will be ready. This information is not intended to replace advice given to you by your health care provider. Make sure you discuss any questions you have with your health care provider. Document Revised: 02/28/2021 Document Reviewed: 01/28/2021 Elsevier Patient Education  2022 [employer].

## 2021-07-10 ENCOUNTER — Telehealth: Payer: Self-pay | Admitting: Neurology

## 2021-07-10 NOTE — Telephone Encounter (Signed)
Referral has been sent to [facility]. Phone: [phone]. 

## 2021-07-16 ENCOUNTER — Other Ambulatory Visit: Payer: Self-pay

## 2021-07-16 ENCOUNTER — Ambulatory Visit: Payer: 59 | Attending: Neurology | Admitting: Physical Therapy

## 2021-07-16 ENCOUNTER — Encounter: Payer: Self-pay | Admitting: Physical Therapy

## 2021-07-16 DIAGNOSIS — R200 Anesthesia of skin: Secondary | ICD-10-CM | POA: Insufficient documentation

## 2021-07-16 DIAGNOSIS — G8929 Other chronic pain: Secondary | ICD-10-CM | POA: Diagnosis not present

## 2021-07-16 DIAGNOSIS — R208 Other disturbances of skin sensation: Secondary | ICD-10-CM | POA: Diagnosis present

## 2021-07-16 DIAGNOSIS — R29898 Other symptoms and signs involving the musculoskeletal system: Secondary | ICD-10-CM | POA: Insufficient documentation

## 2021-07-16 DIAGNOSIS — S4992XA Unspecified injury of left shoulder and upper arm, initial encounter: Secondary | ICD-10-CM | POA: Insufficient documentation

## 2021-07-16 DIAGNOSIS — M6281 Muscle weakness (generalized): Secondary | ICD-10-CM | POA: Insufficient documentation

## 2021-07-16 DIAGNOSIS — M4802 Spinal stenosis, cervical region: Secondary | ICD-10-CM | POA: Diagnosis not present

## 2021-07-16 DIAGNOSIS — M5412 Radiculopathy, cervical region: Secondary | ICD-10-CM | POA: Diagnosis not present

## 2021-07-16 DIAGNOSIS — M542 Cervicalgia: Secondary | ICD-10-CM | POA: Diagnosis present

## 2021-07-16 DIAGNOSIS — R278 Other lack of coordination: Secondary | ICD-10-CM | POA: Diagnosis present
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
## 2021-08-01 ENCOUNTER — Ambulatory Visit: Payer: 59

## 2021-08-03 ENCOUNTER — Ambulatory Visit: Payer: 59

## 2021-08-06 ENCOUNTER — Ambulatory Visit: Payer: 59 | Attending: Registered Nurse | Admitting: Physical Therapy

## 2021-08-06 ENCOUNTER — Telehealth: Payer: Self-pay | Admitting: *Deleted

## 2021-08-06 NOTE — Telephone Encounter (Signed)
Pt called for update on his [place] and [facility] form. Please call [phone]

## 2021-08-06 NOTE — Telephone Encounter (Signed)
Spoke with Stanton Kidney. Will be discussing with Dr Lucia Gaskins.

## 2021-08-06 NOTE — Telephone Encounter (Signed)
LOA form [place] completed, signed, and sent to medical records for processing. Ok with Dr Lucia Gaskins for pt to remain out of work until his EMG on 08/16/21. Return to work 08/17/21.

## 2021-08-07 ENCOUNTER — Telehealth: Payer: Self-pay | Admitting: Neurology

## 2021-08-07 NOTE — Telephone Encounter (Signed)
Becky @ Dr Marcy Siren office left a vm @ 4:29 on yesterday  stating she was looking for clarity re: a request for a work note.  Kriste Basque is asking if [employer] is asking for a work note for pt or it was a call to say that [employer] is providing a work note.  Please call Becky @ [phone]

## 2021-08-07 NOTE — Telephone Encounter (Signed)
LMVM for Eugene Franco x 218 that returning call to her.  Looks like pt off work till [employer] 08-16-2021, if needing more time to go thru Dr. Maurice Small.

## 2021-08-07 NOTE — Telephone Encounter (Signed)
Pt [place] form faxed on 08/07/2021 medical records faxed to [facility] on 08/06/2021.

## 2021-08-08 ENCOUNTER — Ambulatory Visit: Payer: 59

## 2021-08-08 NOTE — Telephone Encounter (Signed)
I called and spoke to [facility] at Dr. Colleen Can office, she said that she will fax Dr. Ruthine Dose; note to [place] again, but it did not state that he placed him out of work.  He did state that due to L UE weakness would take time to improve 2-3 months for nerve damage recovery.  Will see back prn.  I relayed that Dr. Jaynee Eagles had him out of work until 08-17-2021 day after his [place] EMG.

## 2021-08-13 ENCOUNTER — Ambulatory Visit: Payer: 59 | Admitting: Physical Therapy

## 2021-08-13 ENCOUNTER — Telehealth: Payer: Self-pay | Admitting: Physical Therapy

## 2021-08-13 NOTE — Telephone Encounter (Addendum)
Contacted patient regarding 3 therapy visit no shows.    Pt reports he is currently unable to pay the therapy co-pay due to being out of work and he is waiting on disability paperwork to be approved.  Pt also reports symptoms have worsened and he is now experiencing symptoms in RUE and back.  Pt is scheduled for EMG/NCV testing on Thursday at neurologist next door.  Following testing pt to follow up with PT about results and plan for return to therapy.  Therapy appointment for today and Wednesday cancelled.   Dierdre Highman, PT, DPT 08/13/21    12:08 PM

## 2021-08-14 ENCOUNTER — Encounter: Payer: Self-pay | Admitting: *Deleted

## 2021-08-15 ENCOUNTER — Telehealth (HOSPITAL_COMMUNITY): Payer: Commercial Managed Care - HMO | Admitting: Physician Assistant

## 2021-08-15 ENCOUNTER — Ambulatory Visit: Payer: 59

## 2021-08-15 ENCOUNTER — Encounter (HOSPITAL_COMMUNITY): Payer: Self-pay

## 2021-08-16 ENCOUNTER — Ambulatory Visit (INDEPENDENT_AMBULATORY_CARE_PROVIDER_SITE_OTHER): Payer: 59 | Admitting: Neurology

## 2021-08-16 ENCOUNTER — Encounter: Payer: Self-pay | Admitting: Neurology

## 2021-08-16 DIAGNOSIS — M5412 Radiculopathy, cervical region: Secondary | ICD-10-CM

## 2021-08-16 DIAGNOSIS — G8929 Other chronic pain: Secondary | ICD-10-CM

## 2021-08-16 DIAGNOSIS — R29898 Other symptoms and signs involving the musculoskeletal system: Secondary | ICD-10-CM

## 2021-08-16 DIAGNOSIS — R200 Anesthesia of skin: Secondary | ICD-10-CM

## 2021-08-16 DIAGNOSIS — S4992XA Unspecified injury of left shoulder and upper arm, initial encounter: Secondary | ICD-10-CM

## 2021-08-16 DIAGNOSIS — M542 Cervicalgia: Secondary | ICD-10-CM

## 2021-08-16 DIAGNOSIS — Z0289 Encounter for other administrative examinations: Secondary | ICD-10-CM

## 2021-08-16 DIAGNOSIS — M4802 Spinal stenosis, cervical region: Secondary | ICD-10-CM

## 2021-08-16 DIAGNOSIS — G5692 Unspecified mononeuropathy of left upper limb: Secondary | ICD-10-CM

## 2021-08-16 NOTE — Progress Notes (Signed)
° ° °   °Full Name: Eugene Franco Gender: Male °MRN #: [id number] Date of Birth: 12/20/1975 °   °Visit Date: 08/16/2021 07:40 °Age: 45 Years °Examining Physician: Daneka Lantigua, MD  °Requesting Provider:  Nanavati, Ankit, MD °Primary Care Provider:  Prevost, Mary Ellen, FNP °Height: 5 feet 11 inch 201lbs Temp: 37.0C ° °History: Patient is here for EMG nerve conduction study for a chief complaint of arm numbness and weakness.  Patient woke up after sleeping on the floor with left upper extremity numbness.. The left arm was likely nerve compression after sleeping on the floor as symptoms have now resolved.  Left arm has signficantly improved, strength intact on limited testing. He reports his right continues with neck pain, and radicular pain, with triceps weakness.  He has significant degenerative disease and etiology of right arm pain is likely cervical radiculopathy. ° °Summary: Nerve conduction studies and EMG needle study were performed on the bilateral upper extremities. All nerves (as indicated in the following tables) were within normal limits.  EMG needle study showed increased spontaneous activity in the lower cervical right paraspinal muscles, the right pronator teres and right triceps muscles showed increased motor unit amplitude, polyphasic motor units and diminished motor unit recruitment. All remaining muscles (as indicated in the following tables) were within normal limits.   ° ° °   °Conclusion:  °1.  Nerve conduction studies and EMG of the left upper extremity normal.The left arm symptoms were likely nerve compression after sleeping on the floor as symptoms have now resolved.  °2. 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  Follow-up with Dr. Ostergaard as needed. ° ° ° ° °------------------------------- °Wyley Hack M.D. ° °[facility] °[address], [address] °[place], [place] [address] °Tel: [phone] °Fax: [phone] ° °Verbal informed consent was obtained from the patient, patient was informed of potential risk of procedure, including bruising, bleeding, hematoma formation, infection, muscle weakness, muscle pain, numbness, among others. °   ° °   °MNC °   °Nerve / Sites Muscle Latency Ref. Amplitude Ref. Rel Amp Segments Distance Velocity Ref. Area  °  ms ms mV mV %  cm m/s m/s mVms  °L Median - APB  °   Wrist APB 3.4 ?4.4 10.6 ?4.0 100 Wrist - APB 7   38.4  °   Upper arm APB 8.2  10.4  97.9 Upper arm - Wrist 24 51 ?49 37.7  °R Median - APB  °   Wrist APB 4.3 ?4.4 5.1 ?4.0 100 Wrist - APB 7   17.7  °   Upper arm APB 9.0  4.7  92 Upper arm - Wrist 23 49 ?49 16.3  °L Ulnar - ADM  °   Wrist ADM 2.8 ?3.3 12.4 ?6.0 100 Wrist - ADM 7   36.7  °   B.Elbow ADM 6.5  11.2  90.1 B.Elbow - Wrist 20 54 ?49 35.7  °   A.Elbow ADM 8.3  11.0  98.4 A.Elbow - B.Elbow 10 54 ?49 35.7  °R Ulnar - ADM  °   Wrist ADM 2.4 ?3.3 10.0 ?6.0 100 Wrist - ADM 7   30.8  °   B.Elbow ADM 6.3  10.3  103 B.Elbow - Wrist 21 55 ?49   30.8  °   A.Elbow ADM 8.1  9.9  95.4 A.Elbow - B.Elbow 10 55 ?49 29.9  °           °SNC °   °Nerve / Sites Rec. Site Peak Lat Ref.  Amp Ref. Segments Distance Peak Diff Ref.  °  ms ms µV µV  cm ms ms  °L Radial - Anatomical snuff box (Forearm)  °   Forearm Wrist 2.5 ?2.9 16 ?15 Forearm - Wrist 10    °R Radial - Anatomical snuff box (Forearm)  °   Forearm Wrist 2.5 ?2.9 19 ?15 Forearm - Wrist 10    °L Median, Ulnar - Transcarpal comparison  °   Median Palm Wrist 2.0 ?2.2 38 ?35 Median Palm - Wrist 8    °   Ulnar Palm Wrist 2.0 ?2.2 13 ?12 Ulnar Palm - Wrist 8    °      Median Palm - Ulnar Palm  0.0 ?0.4  °R Median, Ulnar - Transcarpal comparison  °   Median Palm Wrist 2.2 ?2.2 54 ?35  Median Palm - Wrist 8    °   Ulnar Palm Wrist 1.9 ?2.2 13 ?12 Ulnar Palm - Wrist 8    °      Median Palm - Ulnar Palm  0.3 ?0.4  °L Median - Orthodromic (Dig II, Mid palm)  °   Dig II Wrist 3.0 ?3.4 14 ?10 Dig II - Wrist 13    °R Median - Orthodromic (Dig II, Mid palm)  °   Dig II Wrist 3.4 ?3.4 10 ?10 Dig II - Wrist 13    °L Ulnar - Orthodromic, (Dig V, Mid palm)  °   Dig V Wrist 2.8 ?3.1 6 ?5 Dig V - Wrist 11    °R Ulnar - Orthodromic, (Dig V, Mid palm)  °   Dig V Wrist 2.7 ?3.1 6 ?5 Dig V - Wrist 11    °                   °F  Wave °   °Nerve F Lat Ref.  ° ms ms  °L Ulnar - ADM 29.5 ?32.0  °R Ulnar - ADM 30.7 ?32.0  °       °EMG Summary Table   ° Spontaneous MUAP Recruitment  °Muscle IA Fib PSW Fasc Other Amp Dur. Poly Pattern  °L. Cervical paraspinals (low) Normal None None None _______ Normal Normal Normal Normal  °R. Cervical paraspinals (low) Normal None 1+ None _______ Normal Normal Normal Normal  °L. Deltoid Normal None None None _______ Normal Normal Normal Normal  °R. Deltoid Normal None None None _______ Normal Normal Normal Normal  °R. Triceps brachii Normal None None None _______ Normal Increased 4+ Reduced  °L. Triceps brachii Normal None None None _______ Normal Normal Normal Normal  °L. Biceps brachii Normal None None None _______ Normal Normal Normal Normal  °R. Biceps brachii Normal None None None _______ Normal Normal Normal Normal  °L. Pronator teres Normal None None None _______ Normal Normal Normal Normal  °R. Pronator teres Normal None None None _______ Normal Increased 3+ Reduced  °L. Extensor digitorum communis Normal None None None _______ Normal Normal Normal Normal  °R. Extensor digitorum communis Normal None None None _______ Normal Normal Normal Normal  °L. Extensor indicis proprius Normal None None None _______ Normal Normal Normal Normal  °R. Extensor indicis proprius Normal None None None _______ Normal Normal Normal Normal  °L. First   dorsal interosseous Normal None None None _______  Normal Normal Normal Normal  °R. First dorsal interosseous Normal None None None _______ Normal Normal Normal Normal  °L. Opponens pollicis Normal None None None _______ Normal Normal Normal Normal  °R. Opponens pollicis Normal None None None _______ Normal Normal Normal Normal  ° °  °

## 2021-08-19 DIAGNOSIS — G5692 Unspecified mononeuropathy of left upper limb: Secondary | ICD-10-CM | POA: Insufficient documentation

## 2021-08-19 NOTE — Progress Notes (Signed)
See procedure note.

## 2021-08-20 ENCOUNTER — Ambulatory Visit: Payer: 59 | Admitting: Physical Therapy

## 2021-08-20 DIAGNOSIS — M5412 Radiculopathy, cervical region: Secondary | ICD-10-CM | POA: Insufficient documentation

## 2021-08-20 NOTE — Procedures (Signed)
Full Name: Eugene Franco Gender: Male MRN #: [id number] Date of Birth: January 17, 1976    Visit Date: 08/16/2021 07:40 Age: 46 Years Examining Physician: Sarina Ill, MD  Requesting Provider:  Varney Biles, MD Primary Care Provider:  Holland Commons, FNP Height: 5 feet 11 inch 201lbs Temp: 37.0C  History: Patient is here for EMG nerve conduction study for a chief complaint of arm numbness and weakness.  Patient woke up after sleeping on the floor with left upper extremity numbness.. The left arm was likely nerve compression after sleeping on the floor as symptoms have now resolved.  Left arm has signficantly improved, strength intact on limited testing. He reports his right continues with neck pain, and radicular pain, with triceps weakness.  He has significant degenerative disease and etiology of right arm pain is likely cervical radiculopathy.  Summary: Nerve conduction studies and EMG needle study were performed on the bilateral upper extremities. All nerves (as indicated in the following tables) were within normal limits.  EMG needle study showed increased spontaneous activity in the lower cervical right paraspinal muscles, the right pronator teres and right triceps muscles showed increased motor unit amplitude, polyphasic motor units and diminished motor unit recruitment. All remaining muscles (as indicated in the following tables) were within normal limits.        Conclusion:  1.  Nerve conduction studies and EMG of the left upper extremity normal.The left arm symptoms were likely nerve compression after sleeping on the floor as symptoms have now resolved.  2. 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  Follow-up with Dr. Venetia Constable as needed.     ------------------------------- Sarina Ill M.D.  [facility] [address], [place], [place] [address] Tel: [phone] Fax: [phone]  Verbal informed consent was obtained from the patient, patient was informed of potential risk of procedure, including bruising, bleeding, hematoma formation, infection, muscle weakness, muscle pain, numbness, among others.        [place]    Nerve / Sites Muscle Latency Ref. Amplitude Ref. Rel Amp Segments Distance Velocity Ref. Area    ms ms mV mV %  cm m/s m/s mVms  L Median - APB     Wrist APB 3.4 ?4.4 10.6 ?4.0 100 Wrist - APB 7   38.4     Upper arm APB 8.2  10.4  97.9 Upper arm - Wrist 24 51 ?49 37.7  R Median - APB     Wrist APB 4.3 ?4.4 5.1 ?4.0 100 Wrist - APB 7   17.7     Upper arm APB 9.0  4.7  92 Upper arm - Wrist 23 49 ?49 16.3  L Ulnar - ADM     Wrist ADM 2.8 ?3.3 12.4 ?6.0 100 Wrist - ADM 7   36.7     B.Elbow ADM 6.5  11.2  90.1 B.Elbow - Wrist 20 54 ?49 35.7     A.Elbow ADM 8.3  11.0  98.4 A.Elbow - B.Elbow 10 54 ?49 35.7  R Ulnar - ADM     Wrist ADM 2.4 ?3.3 10.0 ?6.0 100 Wrist - ADM 7   30.8     B.Elbow ADM 6.3  10.3  103 B.Elbow - Wrist 21 55 ?49  30.8     A.Elbow ADM 8.1  9.9  95.4 A.Elbow - B.Elbow 10 55 ?49 29.9             SNC    Nerve / Sites Rec. Site Peak Lat Ref.  Amp Ref. Segments Distance Peak Diff Ref.    ms ms V V  cm ms ms  L Radial - Anatomical snuff box (Forearm)     Forearm Wrist 2.5 ?2.9 16 ?15 Forearm - Wrist 10    R Radial - Anatomical snuff box (Forearm)     Forearm Wrist 2.5 ?2.9 19 ?15 Forearm - Wrist 10    L Median, Ulnar - Transcarpal comparison     Median Palm Wrist 2.0 ?2.2 38 ?35 Median Palm - Wrist 8       Ulnar Palm Wrist 2.0 ?2.2 13 ?12 Ulnar Palm - Wrist 8          Median Palm - Ulnar Palm  0.0 ?0.4  R Median, Ulnar - Transcarpal comparison     Median Palm Wrist 2.2 ?2.2 54 ?35  Median Palm - Wrist 8       Ulnar Palm Wrist 1.9 ?2.2 13 ?12 Ulnar Palm - Wrist 8          Median Palm - Ulnar Palm  0.3 ?0.4  L Median - Orthodromic (Dig II, Mid palm)     Dig II Wrist 3.0 ?3.4 14 ?10 Dig II - Wrist 13    R Median - Orthodromic (Dig II, Mid palm)     Dig II Wrist 3.4 ?3.4 10 ?10 Dig II - Wrist 13    L Ulnar - Orthodromic, (Dig V, Mid palm)     Dig V Wrist 2.8 ?3.1 6 ?5 Dig V - Wrist 11    R Ulnar - Orthodromic, (Dig V, Mid palm)     Dig V Wrist 2.7 ?3.1 6 ?5 Dig V - Wrist 35                       F  Wave    Nerve F Lat Ref.   ms ms  L Ulnar - ADM 29.5 ?32.0  R Ulnar - ADM 30.7 ?32.0         EMG Summary Table    Spontaneous MUAP Recruitment  Muscle IA Fib PSW Fasc Other Amp Dur. Poly Pattern  L. Cervical paraspinals (low) Normal None None None _______ Normal Normal Normal Normal  R. Cervical paraspinals (low) Normal None 1+ None _______ Normal Normal Normal Normal  L. Deltoid Normal None None None _______ Normal Normal Normal Normal  R. Deltoid Normal None None None _______ Normal Normal Normal Normal  R. Triceps brachii Normal None None None _______ Normal Increased 4+ Reduced  L. Triceps brachii Normal None None None _______ Normal Normal Normal Normal  L. Biceps brachii Normal None None None _______ Normal Normal Normal Normal  R. Biceps brachii Normal None None None _______ Normal Normal Normal Normal  L. Pronator teres Normal None None None _______ Normal Normal Normal Normal  R. Pronator teres Normal None None None _______ Normal Increased 3+ Reduced  L. Extensor digitorum communis Normal None None None _______ Normal Normal Normal Normal  R. Extensor digitorum communis Normal None None None _______ Normal Normal Normal Normal  L. Extensor indicis proprius Normal None None None _______ Normal Normal Normal Normal  R. Extensor indicis proprius Normal None None None _______ Normal Normal Normal Normal  L. First  dorsal interosseous Normal None None None _______  Normal Normal Normal Normal  R. First dorsal interosseous Normal None None None _______ Normal Normal Normal Normal  L. Opponens pollicis Normal None None None _______ Normal Normal Normal Normal  R. Opponens pollicis Normal None None None _______ Normal Normal Normal Normal

## 2021-08-22 ENCOUNTER — Ambulatory Visit: Payer: 59

## 2021-08-27 ENCOUNTER — Telehealth: Payer: Self-pay | Admitting: Physical Therapy

## 2021-08-27 ENCOUNTER — Ambulatory Visit: Payer: 59 | Admitting: Physical Therapy

## 2021-08-27 ENCOUNTER — Encounter: Payer: Self-pay | Admitting: Physical Therapy

## 2021-08-27 NOTE — Therapy (Signed)
[facility] [address] [place], [place], [address] Phone: [phone]   Fax:  [phone]  Patient Details  Name: Eugene Franco MRN: [id number] Date of Birth: Jun 04, 1976 Referring Provider:  No ref. provider found  Encounter Date: 08/27/2021  PHYSICAL THERAPY DISCHARGE SUMMARY  Visits from Start of Care: 1  Current functional level related to goals / functional outcomes: Unable to assess; pt did not return for follow up visits after evaluation.  When PT contacted pt he reported he was waiting on disability and was not able to afford co-pay.  Pt was going to contact clinic after nerve conduction study to decide if he would continue with therapy or not; pt did not follow up with PT so PT attempted to call patient.  LVM.  Pt has not returned clinic's phone call.  Will proceed with D/C at this time.  Pt will require new order if symptoms continue and wishes to return to therapy once financial situation improves.   Remaining deficits: See eval   Education / Equipment: N/A   Patient agrees to discharge. Patient goals were not met. Patient is being discharged due to not returning since the last visit.  Rico Junker, PT, DPT 08/27/21    12:20 PM    [facility] [address] [place], [place], [address] Phone: [phone]   Fax:  [phone]

## 2021-08-27 NOTE — Telephone Encounter (Signed)
PT contacted pt about past two no shows.    Contacted patient's phone number, no answer and VM full.  Contacted wife's phone and LVM: alerted pt to two missed appointments and requested call back to clinic and to let PT know if he was able to work out disability and if he wanted to continue with therapy visits or wanted to discontinue at this time.   Pt was supposed to follow up with clinic after appointment with Dr. Lucia Gaskins; this PT did not receive a message from the front regarding patient and if he wanted to continue with therapy or not.    Alerted pt of clinic's attendance policy: 3 no shows and remaining visits would be cancelled and scheduled back in one by one.  If pt does not return call by end of day, visits will be cancelled.  If phone call is not returned by end of week, will proceed with D/C.  Dierdre Highman, PT, DPT 08/27/21    12:37 PM

## 2021-08-29 ENCOUNTER — Ambulatory Visit: Payer: 59

## 2021-08-30 ENCOUNTER — Encounter: Payer: 59 | Admitting: Neurology

## 2021-09-03 ENCOUNTER — Ambulatory Visit: Payer: 59 | Admitting: Physical Therapy

## 2021-09-05 ENCOUNTER — Ambulatory Visit: Payer: 59

## 2021-09-10 ENCOUNTER — Ambulatory Visit: Payer: 59 | Admitting: Physical Therapy

## 2021-09-12 ENCOUNTER — Ambulatory Visit: Payer: 59

## 2021-09-17 ENCOUNTER — Ambulatory Visit: Payer: 59 | Admitting: Physical Therapy

## 2021-09-19 ENCOUNTER — Ambulatory Visit: Payer: 59

## 2021-09-24 ENCOUNTER — Ambulatory Visit: Payer: 59 | Admitting: Physical Therapy

## 2021-09-26 ENCOUNTER — Ambulatory Visit: Payer: 59

## 2022-03-13 ENCOUNTER — Other Ambulatory Visit: Payer: Self-pay | Admitting: Registered Nurse

## 2022-03-13 ENCOUNTER — Other Ambulatory Visit (HOSPITAL_COMMUNITY): Payer: Self-pay | Admitting: Registered Nurse

## 2022-03-13 DIAGNOSIS — E7800 Pure hypercholesterolemia, unspecified: Secondary | ICD-10-CM

## 2022-03-27 ENCOUNTER — Encounter (HOSPITAL_COMMUNITY): Payer: Self-pay

## 2022-03-27 ENCOUNTER — Ambulatory Visit (HOSPITAL_COMMUNITY): Payer: 59

## 2022-03-28 ENCOUNTER — Encounter (HOSPITAL_COMMUNITY): Payer: Self-pay | Admitting: Physician Assistant

## 2022-03-28 ENCOUNTER — Ambulatory Visit (INDEPENDENT_AMBULATORY_CARE_PROVIDER_SITE_OTHER): Payer: Commercial Managed Care - HMO | Admitting: Physician Assistant

## 2022-03-28 DIAGNOSIS — G4700 Insomnia, unspecified: Secondary | ICD-10-CM

## 2022-03-28 DIAGNOSIS — F411 Generalized anxiety disorder: Secondary | ICD-10-CM

## 2022-03-28 MED ORDER — ESCITALOPRAM OXALATE 20 MG PO TABS: 20.0000 mg | ORAL_TABLET | Freq: Every day | ORAL | 2 refills | Status: AC

## 2022-03-28 MED ORDER — BUSPIRONE HCL 10 MG PO TABS: 10.0000 mg | ORAL_TABLET | ORAL | 2 refills | Status: AC | PRN

## 2022-03-28 MED ORDER — TRAZODONE HCL 50 MG PO TABS: 50.0000 mg | ORAL_TABLET | Freq: Every evening | ORAL | 1 refills | Status: DC | PRN

## 2022-03-28 NOTE — Progress Notes (Signed)
[facility] MD/PA/NP OP Progress Note  Virtual Visit via Telephone Note  I connected with Eugene Franco on 03/28/22 at  8:00 AM EDT by telephone and verified that I am speaking with the correct person using two identifiers.  Location: Patient: Home Provider: Clinic   I discussed the limitations, risks, security and privacy concerns of performing an evaluation and management service by telephone and the availability of in person appointments. I also discussed with the patient that there may be a patient responsible charge related to this service. The patient expressed understanding and agreed to proceed.  Follow Up Instructions:  I discussed the assessment and treatment plan with the patient. The patient was provided an opportunity to ask questions and all were answered. The patient agreed with the plan and demonstrated an understanding of the instructions.   The patient was advised to call back or seek an in-person evaluation if the symptoms worsen or if the condition fails to improve as anticipated.  I provided 13 minutes of non-face-to-face time during this encounter.  Eugene Mood, PA    03/28/2022 12:15 PM Eugene Franco  MRN:  [id number]  Chief Complaint:  Chief Complaint   WALK-IN    HPI:   Eugene Franco is a 46 year old male with a past psychiatric history significant for alcohol use disorder, anxiety, and insomnia who presents to [facility] for follow-up and medication management.  Patient was last seen by this provider on 06/12/2021.  During his last encounter, patient was being managed on the following medications:  Escitalopram 10 mg daily Trazodone 50 mg at bedtime  Patient reports that he recently went to his provider yesterday and had his back examined.  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  Patient is currently receiving treatment for his spine.  Since the last  encounter, patient's anxiety has still been an issue.  He reports that his provider raised prescription of Lexapro from 10 mg to 20 mg daily for the management of his anxiety.  Patient states that he was also placed on buspirone 10 mg as needed for the management of his anxiety.  Since the last encounter, patient states that he has been working to find a job.  He reports that he has also decreased his alcohol consumption greatly.  He states that he has been gradually losing the taste for alcohol especially beer.  He reports that he last drank alcohol 3 days ago and reports drinking due to commemorating his uncle who had passed.  He reports that his relationship with his wife has improved as well as with various members of his extended family.  Patient endorses manageable anxiety and rates his anxiety at 4 out of 10.  Patient's stressor involves the recent passing of his uncle.  GAD-7 screen was performed with the patient scoring an 8.  Patient is alert and oriented x4, calm, cooperative, and fully engaged in conversation during the encounter.  Patient endorses okay Franco.  Patient denies suicidal or homicidal ideations.  She further denies auditory or visual hallucinations and does not appear to be responding to internal/external stimuli.  Patient endorses poor sleep and receives on average 3 to 4 hours of sleep each night.  Patient states that he has not had access to trazodone but expresses that the medication was helpful in managing his sleep.  Patient endorses good appetite and eats on average 3 meals per day.  Patient Eugene Franco alcohol  consumption.  Patient endorses slight tobacco use.  Patient denies illicit drug use.  Visit Diagnosis:    ICD-10-CM   1. Insomnia, unspecified type  G47.00 traZODone (DESYREL) 50 MG tablet    2. Anxiety state  F41.1 escitalopram (LEXAPRO) 20 MG tablet    busPIRone (BUSPAR) 10 MG tablet      Past Psychiatric History:  Anxiety Insomnia  Past Medical History:  Past  Medical History:  Diagnosis Date   Arthritis    GERD (gastroesophageal reflux disease)    Hypertension     Past Surgical History:  Procedure Laterality Date   WRIST FRACTURE SURGERY Right    left wrist broken as well but no surgery as of yet; [facility]    Family Psychiatric History:  None reported  Family History:  Family History  Problem Relation Age of Onset   Arthritis Mother    Arthritis Father    Cancer Sister        breast   Cancer Sister    Arthritis Maternal Grandmother    Hypertension Maternal Grandmother    Arthritis Maternal Grandfather    Hypertension Maternal Grandfather     Social History:  Social History   Socioeconomic History   Marital status: Married    Spouse name: Not on file   Number of children: Not on file   Years of education: Not on file   Highest education level: Not on file  Occupational History   Not on file  Tobacco Use   Smoking status: Every Day    Packs/day: 0.10    Types: Cigarettes   Smokeless tobacco: Never  Substance and Sexual Activity   Alcohol use: Yes    Comment: case of beer per week and between of 1/5 and a pint   Drug use: Not Currently    Types: Marijuana   Sexual activity: Not on file  Other Topics Concern   Not on file  Social History Narrative   Lives at home with wife, two sons, and niece    Right handed   Caffeine: 1 cup/day   Social Determinants of Health   Financial Resource Strain: Not on file  Food Insecurity: Not on file  Transportation Needs: Not on file  Physical Activity: Not on file  Stress: Not on file  Social Connections: Not on file    Allergies: No Known Allergies  Metabolic Disorder Labs: No results found for: "HGBA1C", "MPG" No results found for: "PROLACTIN" Lab Results  Component Value Date   CHOL 205 (H) 11/22/2010   TRIG 95.0 11/22/2010   HDL 67.10 11/22/2010   CHOLHDL 3 11/22/2010   VLDL 19.0 11/22/2010   No results found for: "TSH"  Therapeutic Level Labs: No  results found for: "LITHIUM" No results found for: "VALPROATE" No results found for: "CBMZ"  Current Medications: Current Outpatient Medications  Medication Sig Dispense Refill   amLODipine (NORVASC) 10 MG tablet Take 1 tablet (10 mg total) by mouth daily. 30 tablet 0   busPIRone (BUSPAR) 10 MG tablet Take 1 tablet (10 mg total) by mouth as needed. 30 tablet 2   docusate sodium (COLACE) 100 MG capsule Take 1 capsule (100 mg total) by mouth every 12 (twelve) hours. 60 capsule 0   hydrochlorothiazide (HYDRODIURIL) 25 MG tablet Take 1 tablet (25 mg total) by mouth daily. 30 tablet 0   ondansetron (ZOFRAN ODT) 4 MG disintegrating tablet Take 1 tablet (4 mg total) by mouth every 8 (eight) hours as needed for nausea. 10 tablet 0  oxyCODONE-acetaminophen (PERCOCET) 10-325 MG tablet Take 1 tablet by mouth every 6 (six) hours.     sildenafil (VIAGRA) 100 MG tablet sildenafil 100 mg tablet  TAKE ONE TABLET BY MOUTH AS NEEDED ONCE DAILY FOR 30 DAYS.     escitalopram (LEXAPRO) 20 MG tablet Take 1 tablet (20 mg total) by mouth daily for 90 doses. 30 tablet 2   traZODone (DESYREL) 50 MG tablet Take 1 tablet (50 mg total) by mouth at bedtime and may repeat dose one time if needed. 60 tablet 1   No current facility-administered medications for this visit.     Musculoskeletal: Strength & Muscle Tone: within normal limits Gait & Station: normal Patient leans: n/a  Psychiatric Specialty Exam: Review of Systems  Psychiatric/Behavioral:  Positive for sleep disturbance. Negative for decreased concentration, dysphoric Franco, hallucinations, self-injury and suicidal ideas. The patient is nervous/anxious. The patient is not hyperactive.     Blood pressure 137/88, pulse 87, height 5\' 11"  (1.803 m), weight 199 lb (90.3 kg).Body mass index is 27.75 kg/m.  General Appearance: Well Groomed  Eye Contact:  Good  Speech:  Clear and Coherent and Normal Rate  Volume:  Normal  Franco:  Anxious and Euthymic  Affect:   Appropriate and Congruent  Thought Process:  Coherent and Descriptions of Associations: Intact  Orientation:  Full (Time, Place, and Person)  Thought Content: WDL   Suicidal Thoughts:  No  Homicidal Thoughts:  No  Memory:  Immediate;   Good Recent;   Good Remote;   Good  Judgement:  Good  Insight:  Fair  Psychomotor Activity:  Restlessness  Concentration:  Concentration: Good and Attention Span: Good  Recall:  Good  Fund of Knowledge: Good  Language: Good  Akathisia:  NA  Handed:  Right  AIMS (if indicated): not done  Assets:  Communication Skills Desire for Improvement Housing Intimacy Social Support Transportation Vocational/Educational  ADL's:  Intact  Cognition: WNL  Sleep:  Poor   Screenings: GAD-7    Flowsheet Row Office Visit from 03/28/2022 in [facility] Video Visit from 06/12/2021 in [facility] Video Visit from 03/15/2021 in [facility] Video Visit from 12/22/2020 in [facility]  Total GAD-7 Score 8 4 11 8       PHQ2-9    [place] Office Visit from 03/28/2022 in [facility] Video Visit from 06/12/2021 in [facility] Video Visit from 03/15/2021 in [facility] Video Visit from 12/22/2020 in [facility] Office Visit from 08/24/2020 in [facility]  PHQ-2 Total Score 1 1 2  0 0  PHQ-9 Total Score -- -- 5 -- --      [place] Visit from 03/28/2022 in [facility] ED from 07/04/2021 in [place] DEPT Video Visit from 06/12/2021 in [place] No Risk No Risk Low Risk        Assessment and Plan:   Eugene Franco is a 46 year old male with a past psychiatric history significant for alcohol use  disorder, anxiety, and insomnia who presents to [facility] for follow-up and medication management.  Patient was last seen by this provider on 06/12/2021.  Since the last encounter, patient has continued to take his Lexapro daily.  He states that his last provider increased his dosage of Lexapro from 10  mg to 20 mg for the management of his anxiety.  He reports that he was also placed on buspirone 10 mg as needed for the further management of his anxiety.  Patient has been without trazodone for some time and continues to express poor sleep quality.  Provider to place patient back on trazodone 50 mg at bedtime.  Provider informed patient that if he still has issues with sleep after 1 dose of trazodone, patient was recommended to take an additional dose.  Patient was agreeable to recommendation.  Patient's medications to be e-prescribed to pharmacy of choice.  Collaboration of Care: Collaboration of Care: Medication Management AEB provider managing patient's psychiatric medications and Psychiatrist AEB patient was seen by a mental health provider  Patient/Guardian was advised Release of Information must be obtained prior to any record release in order to collaborate their care with an outside provider. Patient/Guardian was advised if they have not already done so to contact the registration department to sign all necessary forms in order for [place] to release information regarding their care.   Consent: Patient/Guardian gives verbal consent for treatment and assignment of benefits for services provided during this visit. Patient/Guardian expressed understanding and agreed to proceed.  1. Insomnia, unspecified type  - traZODone (DESYREL) 50 MG tablet; Take 1 tablet (50 mg total) by mouth at bedtime and may repeat dose one time if needed.  Dispense: 60 tablet; Refill: 1  2. Anxiety state  - escitalopram (LEXAPRO) 20 MG tablet; Take 1 tablet (20 mg total) by mouth daily  for 90 doses.  Dispense: 30 tablet; Refill: 2 - busPIRone (BUSPAR) 10 MG tablet; Take 1 tablet (10 mg total) by mouth as needed.  Dispense: 30 tablet; Refill: 2  Patient to follow up in 6 weeks Provider spent a total of 18 with the patient/reviewing the patient's chart  Eugene Mood, PA 03/28/2022, 12:15 PM

## 2022-04-30 ENCOUNTER — Encounter (HOSPITAL_COMMUNITY): Payer: Commercial Managed Care - HMO | Admitting: Student in an Organized Health Care Education/Training Program

## 2022-06-03 ENCOUNTER — Inpatient Hospital Stay (HOSPITAL_BASED_OUTPATIENT_CLINIC_OR_DEPARTMENT_OTHER): Admission: RE | Admit: 2022-06-03 | Payer: 59 | Source: Ambulatory Visit

## 2022-07-08 ENCOUNTER — Ambulatory Visit (HOSPITAL_COMMUNITY)
Admission: EM | Admit: 2022-07-08 | Discharge: 2022-07-08 | Disposition: A | Discharge status: Home / Self Care | Payer: Commercial Managed Care - HMO | Attending: Physician Assistant | Admitting: Physician Assistant

## 2022-07-08 DIAGNOSIS — F1010 Alcohol abuse, uncomplicated: Secondary | ICD-10-CM

## 2022-07-08 DIAGNOSIS — F411 Generalized anxiety disorder: Secondary | ICD-10-CM

## 2022-07-08 DIAGNOSIS — G4700 Insomnia, unspecified: Secondary | ICD-10-CM

## 2022-07-08 MED ORDER — TRAZODONE HCL 50 MG PO TABS: 50.0000 mg | ORAL_TABLET | Freq: Every evening | ORAL | 0 refills | Status: AC | PRN
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
## 2022-07-08 NOTE — ED Triage Notes (Signed)
Pt presents to [facility] voluntarily seeking detox and substance use treatment for alcohol.Pt reports daily use of about a fifth of liquor, 12 pack of beer (16oz). Pt reports occasional use of cocaine about $100 worth. Pt reports last use of alcohol 1 beer and 1 shot this morning, about 1 hour ago. Pt reports being diagnosed with anxiety and is prescribed medication for his symptoms at [facility]. Pt denies SI/HI and AVH at this time.

## 2022-07-09 NOTE — Care Management (Signed)
OBS Care Management   Writer left a HIPAA compliant voice mail with the patient as a follow-up.

## 2022-08-07 IMAGING — MR MR CERVICAL SPINE WO/W CM
7 of 8 series · 33 of 48 positions shown · IV contrast (gadavist)
Comparison: None.

CLINICAL DATA: 45-year-old male with left upper extremity numbness
and weakness for several days.

EXAM:
MRI CERVICAL SPINE WITHOUT AND WITH CONTRAST
TECHNIQUE: Multiplanar and multiecho pulse sequences of the cervical spine, to
include the craniocervical junction and cervicothoracic junction,
were obtained without and with intravenous contrast.
CONTRAST:  8mL GADAVIST GADOBUTROL 1 MMOL/ML IV SOLN

[Series 5: T1 · sagittal · 3.0mm · 0.69mm/px · 4 of 15 slices shown (1 of 2)]
[im 1/15]
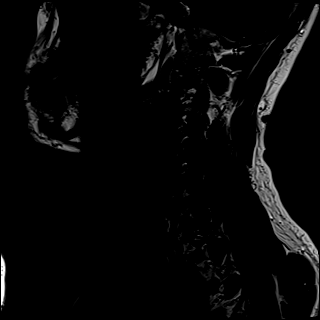
[im 5/15]
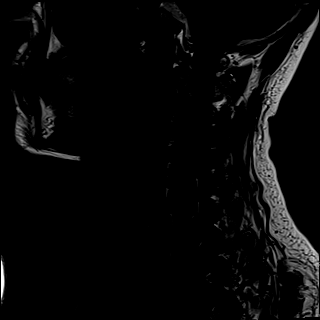
[im 10/15]
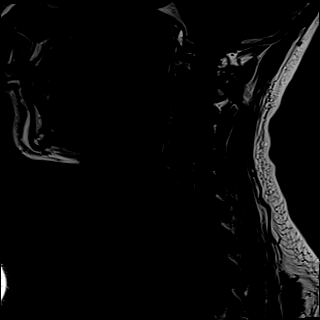
[im 15/15]
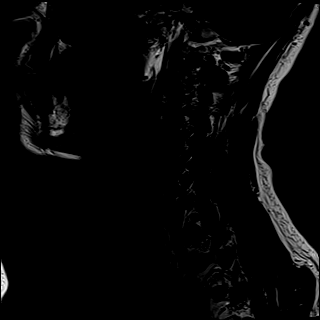

[Series 6: STIR · sagittal · 3.0mm · 0.86mm/px · 4 of 15 slices shown]
[im 1/15]
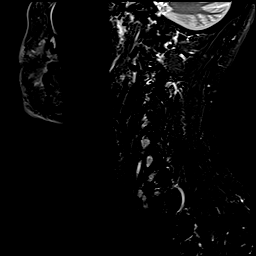
[im 5/15]
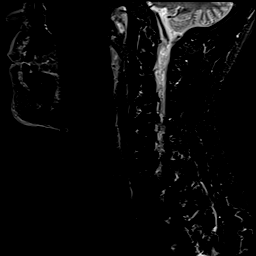
[im 10/15]
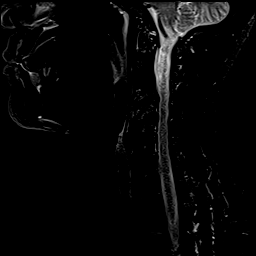
[im 15/15]
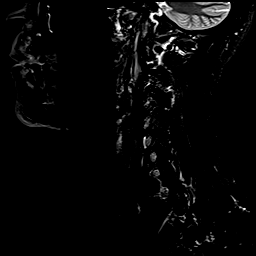

[Series 7: T2 · axial · 3.0mm · 0.70mm/px · z∈[-179,-71]mm · 8 of 32 slices shown]
[im 1/32]
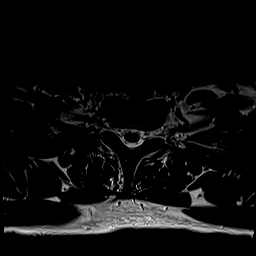
[im 5/32]
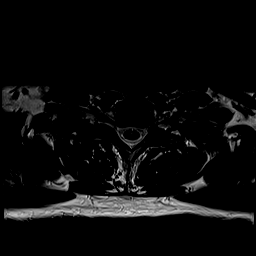
[im 9/32]
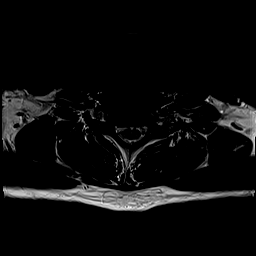
[im 14/32]
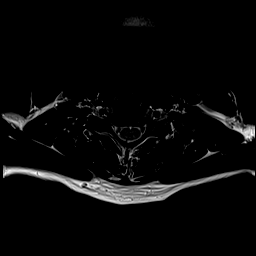
[im 18/32]
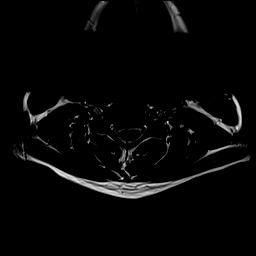
[im 23/32]
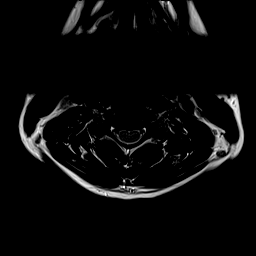
[im 27/32]
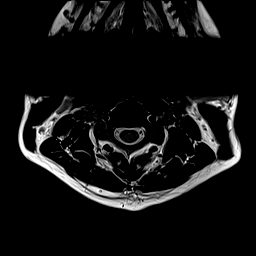
[im 32/32]
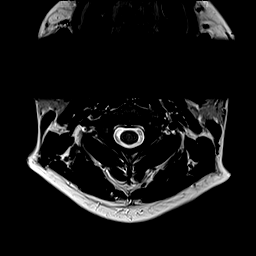

[Series 9: T1 · axial · 3.0mm · 0.35mm/px · z∈[-179,-71]mm · 8 of 32 slices shown (2 of 2)]
[im 1/32]
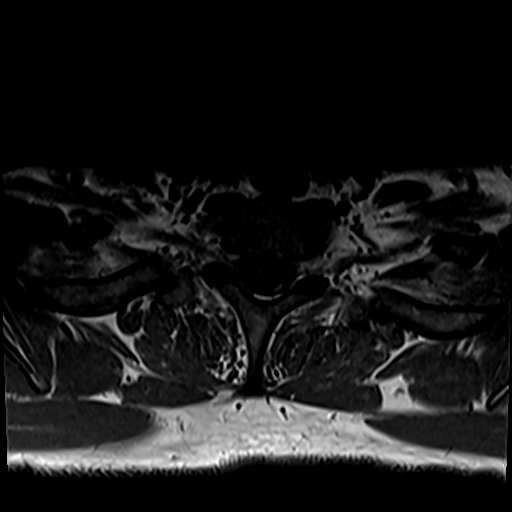
[im 5/32]
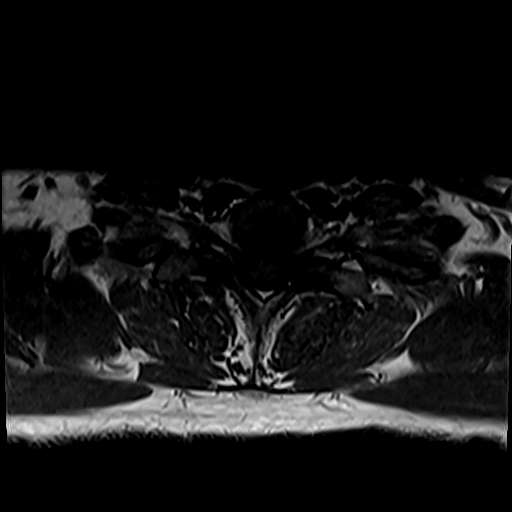
[im 9/32]
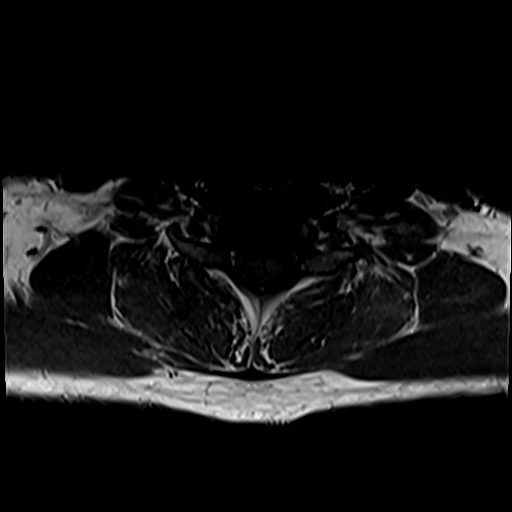
[im 14/32]
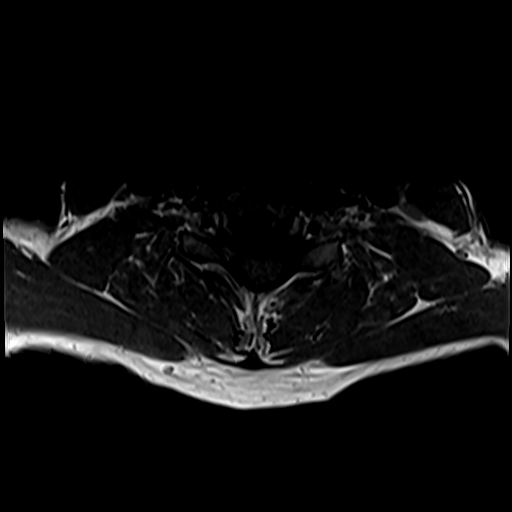
[im 18/32]
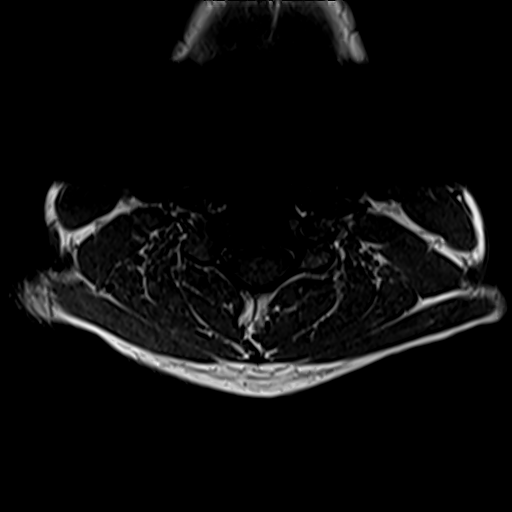
[im 23/32]
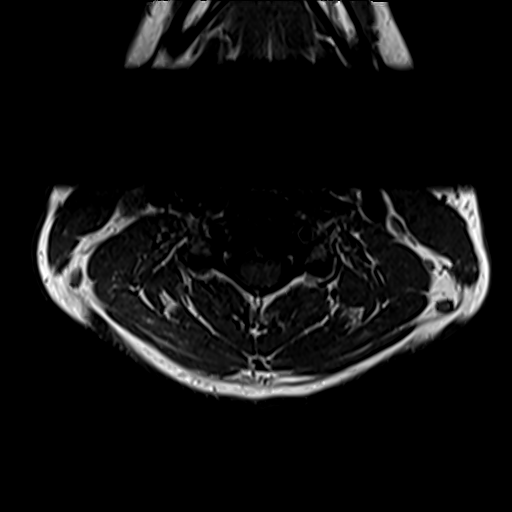
[im 27/32]
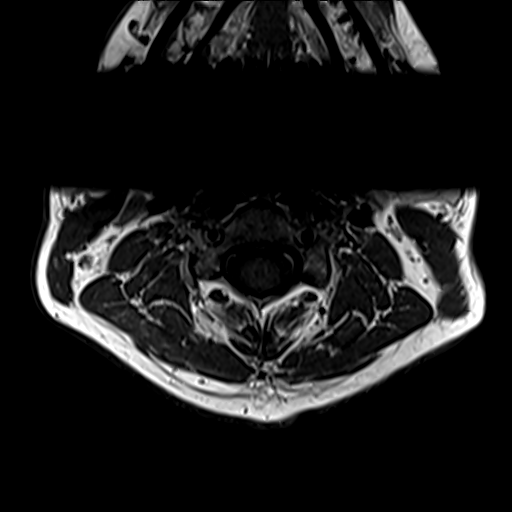
[im 32/32]
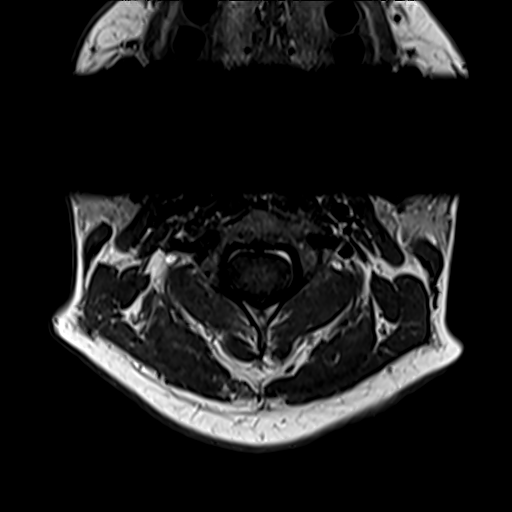

[Series 10: T2 post-contrast · sagittal · 3.0mm · 0.69mm/px · 4 of 15 slices shown]
[im 1/15]
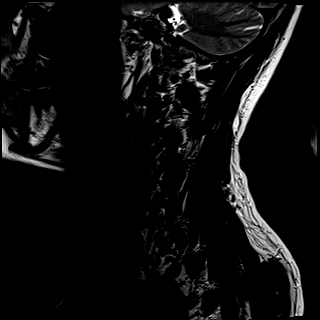
[im 5/15]
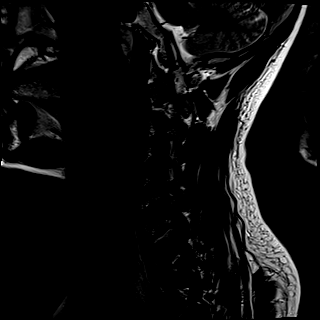
[im 10/15]
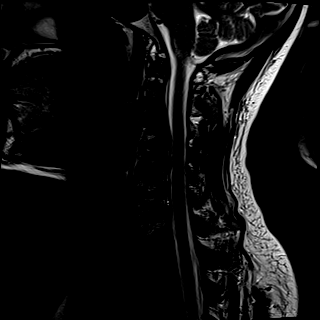
[im 15/15]
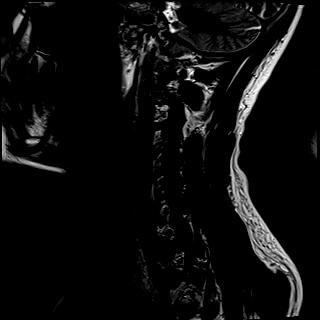

[Series 11: T1 fat-sat post-contrast · sagittal · 3.0mm · 0.69mm/px · 4 of 15 slices shown]
[im 1/15]
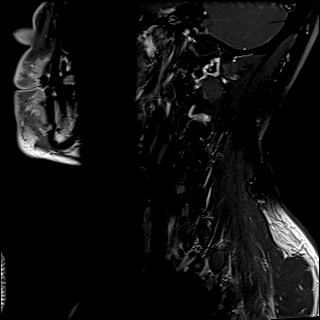
[im 5/15]
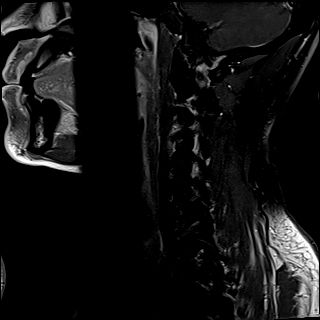
[im 10/15]
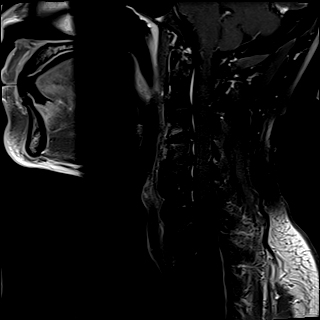
[im 15/15]
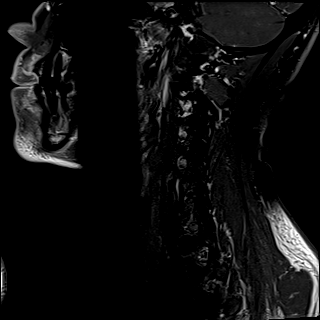

[Series 12: T1 post-contrast · axial · 3.0mm · 0.35mm/px · 1 of 32 slices shown]
[im 1/32]
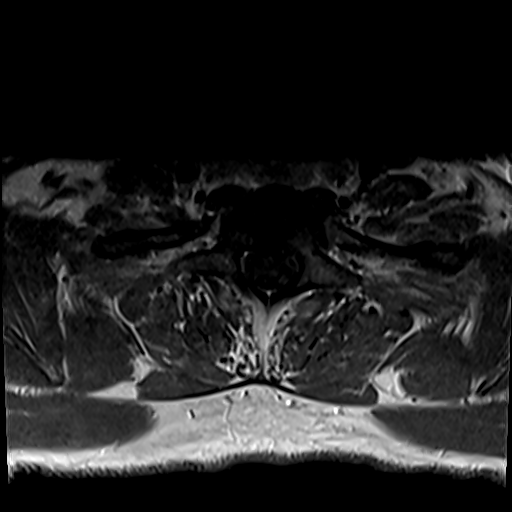

[33 of 48 positions shown; findings below may reference images not displayed]

FINDINGS: Alignment: Straightening of cervical lordosis. Subtle retrolisthesis
of C3 on C4.

Vertebrae: Advanced chronic degenerative endplate marrow signal
changes C5 through C7. Superimposed mild degenerative endplate
marrow edema and enhancement at C3-C4. Background bone marrow signal
within normal limits.

Cord: No spinal cord signal abnormality despite some degenerative
cord mass effect detailed below. No abnormal intradural enhancement.
No dural thickening.

Posterior Fossa, vertebral arteries, paraspinal tissues:
Cervicomedullary junction is within normal limits. Negative visible
posterior fossa. Preserved major vascular flow voids in the neck.
Negative visible neck soft tissues the and right lung apex.

Disc levels:

C2-C3:  Negative.

C3-C4: Mild disc space loss and circumferential disc bulge.
Superimposed central posterior disc protrusion with annular fissure
(series 8, image 10). Mild endplate spurring. Mild spinal stenosis
and ventral cord mass effect. Mild facet hypertrophy. Mild to
moderate bilateral C4 foraminal stenosis.

C4-C5: Disc space loss with mildly lobulated circumferential disc
osteophyte complex eccentric to the right. Mild spinal stenosis.
Mild if any cord mass effect. Moderate left and moderate to severe
right C5 foraminal stenosis.

C5-C6: Disc space loss with mild circumferential disc osteophyte
complex. No spinal stenosis. Moderate right C6 foraminal stenosis.

C6-C7: Disc space loss with mild circumferential disc osteophyte
complex. No spinal stenosis. Mild left but severe right C7 neural
foraminal stenosis.

C7-T1:  Mild facet hypertrophy. No stenosis.

Negative visible upper thoracic spine.
IMPRESSION: 1. Age advanced cervical disc and endplate degeneration. Small
central disc herniation at C3-C4. Mild spinal stenosis and spinal
cord mass effect at that level. Mild spinal stenosis at C4-C5. No
associated
2.  Cord signal abnormality.
3. Severe degenerative neural foraminal stenosis at the right C5 and
C7 nerve levels. Up to moderate foraminal stenosis at the bilateral
C4, left C5, and right C6 nerve levels.

## 2022-08-07 IMAGING — MR MR HEAD WO/W CM
13 series · 48 of 48 positions shown · IV contrast (gadavist)
Comparison: None.

CLINICAL DATA: Neuro deficit, acute, stroke suspected L sided upper
extremity weakness, numbness

EXAM:
MRI HEAD WITHOUT AND WITH CONTRAST
TECHNIQUE: Multiplanar, multiecho pulse sequences of the brain and surrounding
structures were obtained without and with intravenous contrast.
CONTRAST:  8mL GADAVIST GADOBUTROL 1 MMOL/ML IV SOLN

[Series 5: DWI · axial · 3.0mm · 1.36mm/px · z∈[-31,+134]mm · 6 of 112 slices shown (1 of 2)]
[im 1/112]
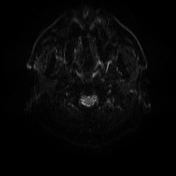
[im 23/112]
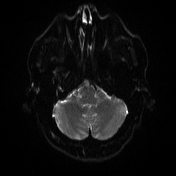
[im 45/112]
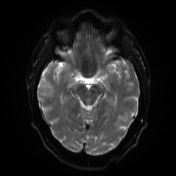
[im 67/112]
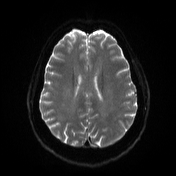
[im 89/112]
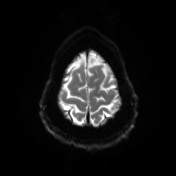
[im 112/112]
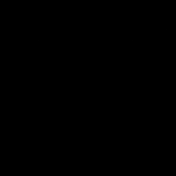

[Series 6: DWI · axial · 3.0mm · 1.36mm/px · z∈[-31,+128]mm · 4 of 54 slices shown (2 of 2)]
[im 1/54]
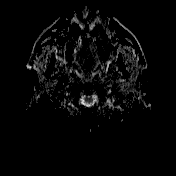
[im 18/54]
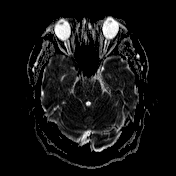
[im 36/54]
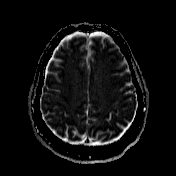
[im 54/54]
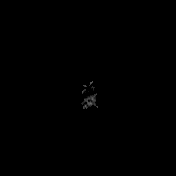

[Series 7: T1 · sagittal · 5.0mm · 0.75mm/px · 2 of 24 slices shown (1 of 2)]
[im 1/24]
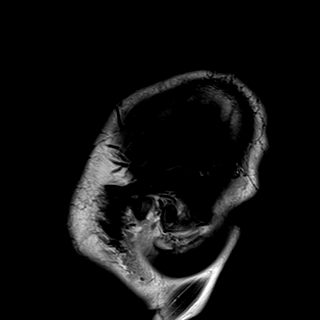
[im 24/24]
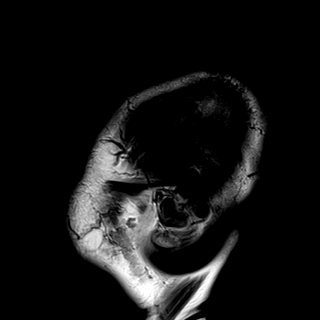

[Series 8: T2 · axial · 5.0mm · 0.62mm/px · z∈[-34,+135]mm · 2 of 27 slices shown (1 of 2)]
[im 1/27]
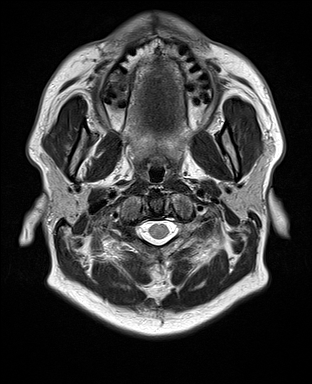
[im 27/27]
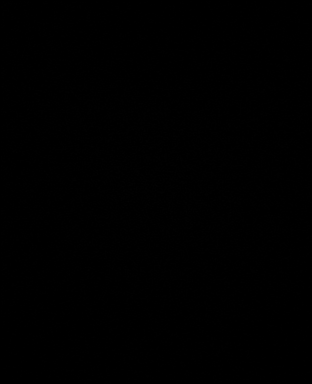

[Series 9: swi_images · axial · 3.0mm · 0.75mm/px · z∈[-38,+139]mm · 4 of 60 slices shown]
[im 1/60]
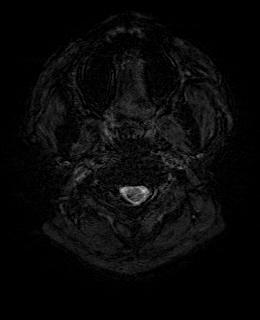
[im 20/60]
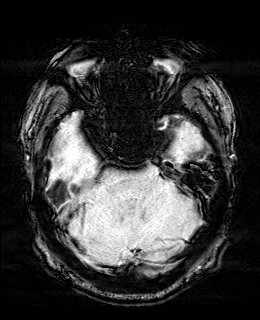
[im 40/60]
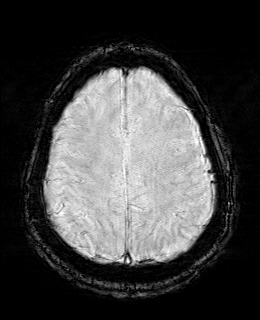
[im 60/60]
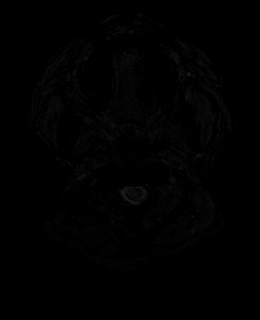

[Series 11: FLAIR · axial · 3.0mm · 0.75mm/px · z∈[-32,+133]mm · 4 of 56 slices shown]
[im 1/56]
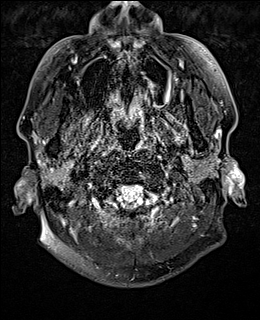
[im 19/56]
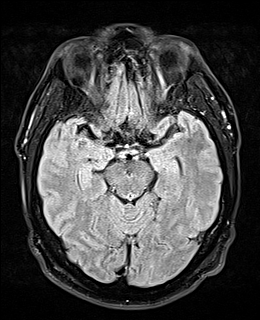
[im 37/56]
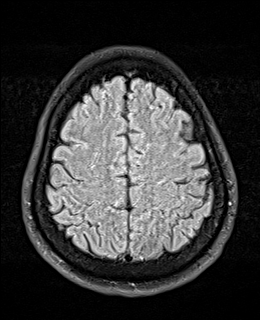
[im 56/56]
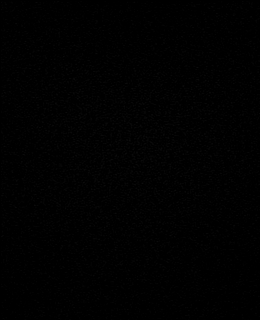

[Series 12: T1 · axial · 3.0mm · 0.45mm/px · z∈[-26,+130]mm · 3 of 53 slices shown (2 of 2)]
[im 1/53]
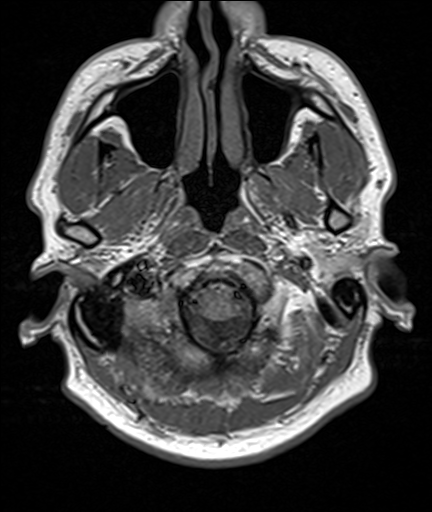
[im 27/53]
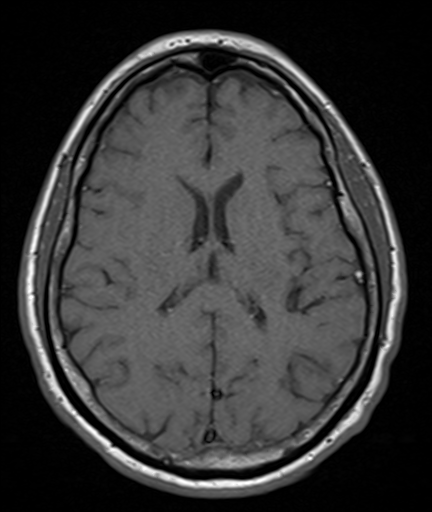
[im 53/53]
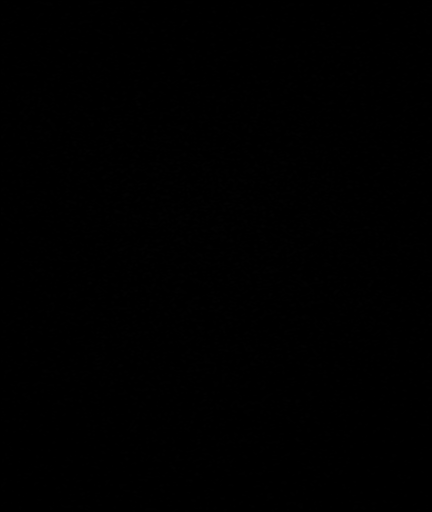

[Series 13: cor dwi_tracew · coronal · 5.0mm · 1.53mm/px · 4 of 58 slices shown]
[im 1/58]
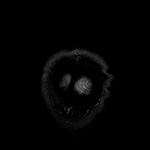
[im 20/58]
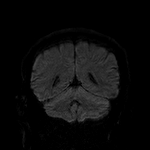
[im 39/58]
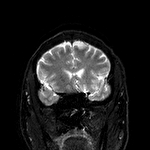
[im 58/58]
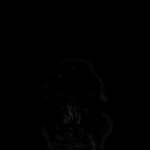

[Series 14: cor dwi_adc · coronal · 5.0mm · 1.53mm/px · 2 of 29 slices shown]
[im 1/29]
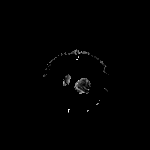
[im 29/29]
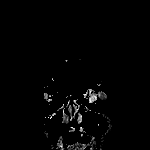

[Series 15: T2 · coronal · 5.0mm · 0.57mm/px · 2 of 36 slices shown (2 of 2)]
[im 1/36]
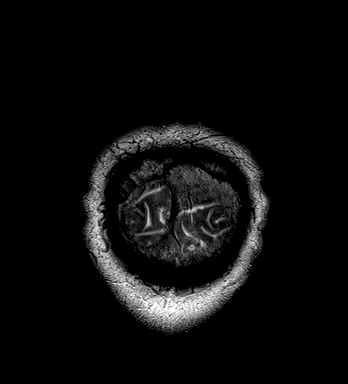
[im 36/36]
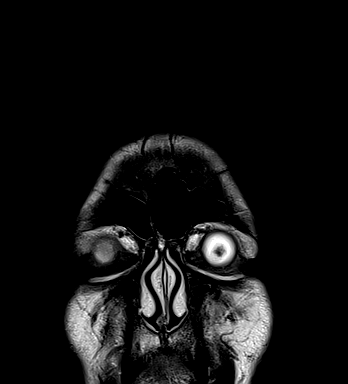

[Series 28: T1 post-contrast · axial · 1.0mm · 0.94mm/px · z∈[-75,+100]mm · 11 of 176 slices shown (1 of 3)]
[im 1/176]
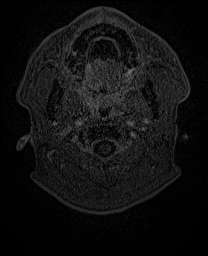
[im 18/176]
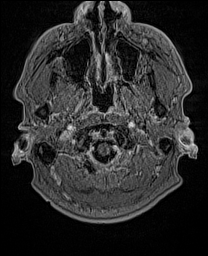
[im 36/176]
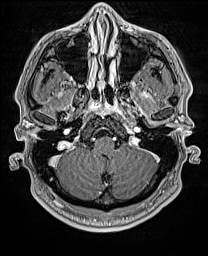
[im 53/176]
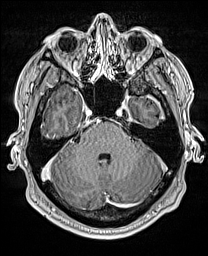
[im 71/176]
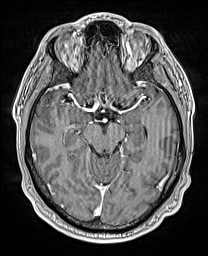
[im 88/176]
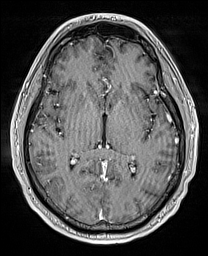
[im 106/176]
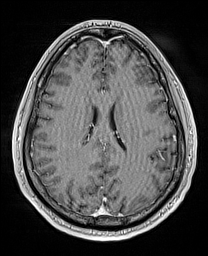
[im 123/176]
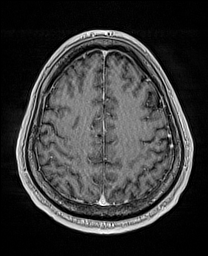
[im 141/176]
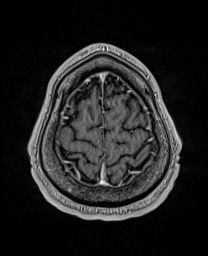
[im 158/176]
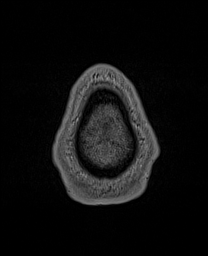
[im 176/176]
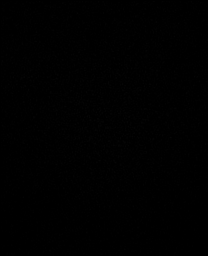

[Series 29: T1 post-contrast · coronal · 5.0mm · 0.43mm/px · 2 of 30 slices shown (2 of 3)]
[im 1/30]
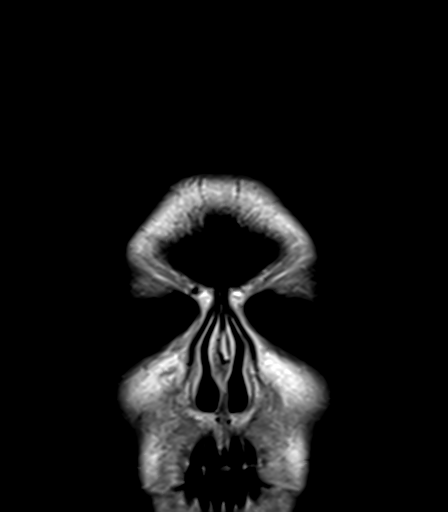
[im 30/30]
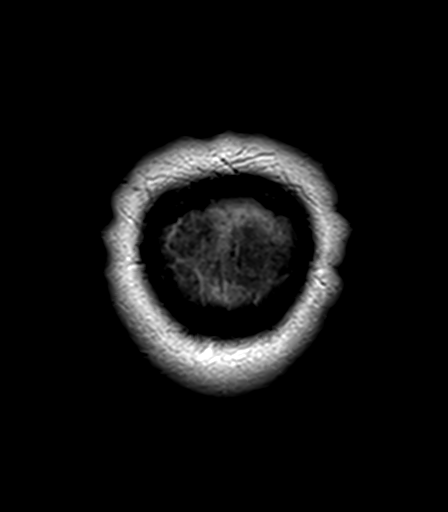

[Series 30: T1 post-contrast · sagittal · 5.0mm · 0.75mm/px · 2 of 24 slices shown (3 of 3)]
[im 1/24]
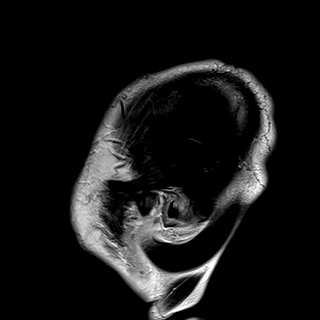
[im 24/24]
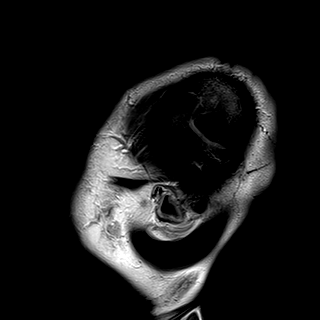

[48 of 48 positions shown; findings below may reference images not displayed]

FINDINGS: Mildly motion limited study.

Brain: No acute infarction, hemorrhage, hydrocephalus, extra-axial
collection or mass lesion. No abnormal enhancement.

Vascular: Major arterial flow voids are maintained skull base.

Skull and upper cervical spine: Normal marrow signal.

Sinuses/Orbits: No significant paranasal sinus disease. Unremarkable
orbits.

Other: No sizable mastoid effusions
IMPRESSION: No evidence of acute intracranial abnormality.

## 2023-04-21 ENCOUNTER — Other Ambulatory Visit (HOSPITAL_COMMUNITY): Payer: Self-pay | Admitting: Registered Nurse

## 2023-04-21 DIAGNOSIS — E7800 Pure hypercholesterolemia, unspecified: Secondary | ICD-10-CM

## 2023-04-25 ENCOUNTER — Ambulatory Visit (HOSPITAL_COMMUNITY)
Admission: RE | Admit: 2023-04-25 | Discharge: 2023-04-25 | Disposition: A | Discharge status: Home / Self Care | Payer: 59 | Source: Ambulatory Visit | Attending: Registered Nurse | Admitting: Registered Nurse

## 2023-04-25 DIAGNOSIS — E7800 Pure hypercholesterolemia, unspecified: Secondary | ICD-10-CM | POA: Insufficient documentation

## 2024-02-02 ENCOUNTER — Ambulatory Visit (HOSPITAL_COMMUNITY)
Admission: EM | Admit: 2024-02-02 | Discharge: 2024-02-02 | Disposition: A | Discharge status: Home / Self Care | Attending: Family Medicine | Admitting: Family Medicine

## 2024-02-02 ENCOUNTER — Other Ambulatory Visit: Payer: Self-pay

## 2024-02-02 ENCOUNTER — Encounter (HOSPITAL_COMMUNITY): Payer: Self-pay | Admitting: *Deleted

## 2024-02-02 ENCOUNTER — Ambulatory Visit (INDEPENDENT_AMBULATORY_CARE_PROVIDER_SITE_OTHER)

## 2024-02-02 DIAGNOSIS — S90812A Abrasion, left foot, initial encounter: Secondary | ICD-10-CM

## 2024-02-02 DIAGNOSIS — M79675 Pain in left toe(s): Secondary | ICD-10-CM

## 2024-02-02 MED ORDER — KETOROLAC TROMETHAMINE 30 MG/ML IJ SOLN
30.0000 mg | Freq: Once | INTRAMUSCULAR | Status: AC
  Administered 2024-02-02: 30 mg via INTRAMUSCULAR

## 2024-02-02 MED ORDER — KETOROLAC TROMETHAMINE 30 MG/ML IJ SOLN
INTRAMUSCULAR | Status: AC
  Filled 2024-02-02: qty 1

## 2024-02-02 MED ORDER — KETOROLAC TROMETHAMINE 10 MG PO TABS: 10.0000 mg | ORAL_TABLET | Freq: Four times a day (QID) | ORAL | 0 refills | Status: AC | PRN

## 2024-02-02 MED ORDER — TETANUS-DIPHTH-ACELL PERTUSSIS 5-2.5-18.5 LF-MCG/0.5 IM SUSY
PREFILLED_SYRINGE | INTRAMUSCULAR | Status: AC
  Filled 2024-02-02: qty 0.5

## 2024-02-02 MED ORDER — TETANUS-DIPHTH-ACELL PERTUSSIS 5-2.5-18.5 LF-MCG/0.5 IM SUSY
0.5000 mL | PREFILLED_SYRINGE | Freq: Once | INTRAMUSCULAR | Status: AC
  Administered 2024-02-02: 0.5 mL via INTRAMUSCULAR

## 2024-02-02 NOTE — ED Triage Notes (Signed)
 PT reports his glass front door fell off his hinges on Friday and landed on the toes on Lt foot.Pt has a hematoma below nail bed of Lt great toe. Pt says the nail on the LT great is loose. Pt also has pain to 2nd,3rd,4th toes.  Pt has been drinking alcohol  for pain relief.

## 2024-02-02 NOTE — ED Provider Notes (Signed)
 [facility]    CSN: [id number] Arrival date & time: 02/02/24  1254      History   Chief Complaint Chief Complaint  Patient presents with   Toe Injury    HPI Eugene Franco is a 48 y.o. male.   HPI Here for pain in his left great toe and in his 2nd and 3rd fourth left toes.  On August 1 his heavy glass front door fell on his toes of his left foot.  He is having a lot of pain there and the great toe is a little swollen.  There are small abrasions on the dorsum of the 2nd through 4th toes.  NKDA   Past Medical History:  Diagnosis Date   Arthritis    GERD (gastroesophageal reflux disease)    Hypertension     Patient Active Problem List   Diagnosis Date Noted   C7 radiculopathy 08/20/2021   Compression neuropathy of left upper extremity 08/19/2021   Cervical radiculopathy 07/09/2021   Insomnia 03/15/2021   Anxiety state 03/15/2021   Alcohol  use disorder, severe, dependence (HCC) 08/30/2020   Thrombosed external hemorrhoid 02/10/2013   Hypertension 12/30/2010   Alcohol  ingestion of one to four drinks per day 12/30/2010   Tobacco use disorder 12/30/2010   Back pain 12/30/2010   Annual physical exam 12/02/2010    Past Surgical History:  Procedure Laterality Date   WRIST FRACTURE SURGERY Right    left wrist broken as well but no surgery as of yet; [facility]       Home Medications    Prior to Admission medications   Medication Sig Start Date End Date Taking? Authorizing Provider  amLODipine  (NORVASC ) 10 MG tablet Take 1 tablet (10 mg total) by mouth daily. 03/15/21  Yes Nwoko, Uchenna E, PA  hydrochlorothiazide  (HYDRODIURIL ) 25 MG tablet Take 1 tablet (25 mg total) by mouth daily. 03/15/21  Yes Nwoko, Uchenna E, PA  ketorolac  (TORADOL ) 10 MG tablet Take 1 tablet (10 mg total) by mouth every 6 (six) hours as needed (pain). 02/02/24  Yes Vonna Sharlet POUR, MD  traZODone  (DESYREL ) 50 MG tablet Take 1 tablet (50 mg total) by mouth at bedtime and may  repeat dose one time if needed. 07/08/22  Yes Nwoko, Uchenna E, PA  escitalopram  (LEXAPRO ) 20 MG tablet Take 1 tablet (20 mg total) by mouth daily for 90 doses. 03/28/22 06/26/22  Nwoko, Uchenna E, PA  ondansetron  (ZOFRAN  ODT) 4 MG disintegrating tablet Take 1 tablet (4 mg total) by mouth every 8 (eight) hours as needed for nausea. 07/30/16   Jarold Olam HERO, PA-C  sildenafil (VIAGRA) 100 MG tablet sildenafil 100 mg tablet  TAKE ONE TABLET BY MOUTH AS NEEDED ONCE DAILY FOR 30 DAYS.    [provider]    Family History Family History  Problem Relation Age of Onset   Arthritis Mother    Arthritis Father    Cancer Sister        breast   Cancer Sister    Arthritis Maternal Grandmother    Hypertension Maternal Grandmother    Arthritis Maternal Grandfather    Hypertension Maternal Grandfather     Social History Social History   Tobacco Use   Smoking status: Every Day    Current packs/day: 0.10    Types: Cigarettes   Smokeless tobacco: Never  Substance Use Topics   Alcohol  use: Yes    Comment: case of beer per week and between of 1/5 and a pint   Drug use:  Not Currently    Types: Marijuana     Allergies   Patient has no known allergies.   Review of Systems Review of Systems   Physical Exam Triage Vital Signs ED Triage Vitals  Encounter Vitals Group     BP 02/02/24 1438 112/72     Girls Systolic BP Percentile --      Girls Diastolic BP Percentile --      Boys Systolic BP Percentile --      Boys Diastolic BP Percentile --      Pulse Rate 02/02/24 1438 72     Resp 02/02/24 1438 18     Temp 02/02/24 1438 98.3 F (36.8 C)     Temp src --      SpO2 02/02/24 1438 96 %     Weight --      Height --      Head Circumference --      Peak Flow --      Pain Score 02/02/24 1434 7     Pain Loc --      Pain Education --      Exclude from Growth Chart --    No data found.  Updated Vital Signs BP 112/72   Pulse 72   Temp 98.3 F (36.8 C)   Resp 18   SpO2 96%    Visual Acuity Right Eye Distance:   Left Eye Distance:   Bilateral Distance:    Right Eye Near:   Left Eye Near:    Bilateral Near:     Physical Exam Vitals reviewed.  Constitutional:      General: He is not in acute distress.    Appearance: He is not toxic-appearing.  Musculoskeletal:     Comments: The left great toe is mildly swollen and there is a hematoma about 2 cm in diameter proximal to the proximal nail fold.  He feels that the nail is detached but it is adhered to the nailbed at this time on the great toe.  There is not much swelling of the 2nd, 3rd and 4th toes.  There are abrasions that are small along the dorsum of each of those toes.   Skin:    Coloration: Skin is not jaundiced or pale.  Neurological:     General: No focal deficit present.     Mental Status: He is alert and oriented to person, place, and time.  Psychiatric:        Behavior: Behavior normal.      UC Treatments / Results  Labs (all labs ordered are listed, but only abnormal results are displayed) Labs Reviewed - No data to display  EKG   Radiology DG Toe Great Left Result Date: 02/02/2024 CLINICAL DATA:  Injury to the left great toe from falling door EXAM: LEFT GREAT TOE COMPARISON:  None Available. FINDINGS: There is no evidence of fracture or dislocation. There is no evidence of arthropathy or other focal bone abnormality. Focal soft tissue swelling of the dorsal great toe. IMPRESSION: Focal soft tissue swelling of the dorsal great toe. No acute fracture or dislocation. Electronically Signed   By: Limin  Xu M.D.   On: 02/02/2024 15:43   DG Foot Complete Left Result Date: 02/02/2024 CLINICAL DATA:  Diffuse toe pain after injury from falling door EXAM: LEFT FOOT - COMPLETE 3 VIEW COMPARISON:  None Available. FINDINGS: There is no evidence of fracture or dislocation. There is no evidence of arthropathy or other focal bone abnormality. Soft tissues are unremarkable. IMPRESSION: No  acute fracture  or dislocation. Electronically Signed   By: Limin  Xu M.D.   On: 02/02/2024 15:42    Procedures Procedures (including critical care time)  Medications Ordered in UC Medications  ketorolac  (TORADOL ) 30 MG/ML injection 30 mg (has no administration in time range)  Tdap (BOOSTRIX ) injection 0.5 mL (has no administration in time range)    Initial Impression / Assessment and Plan / UC Course  I have reviewed the triage vital signs and the nursing notes.  Pertinent labs & imaging results that were available during my care of the patient were reviewed by me and considered in my medical decision making (see chart for details).     X-rays do not show any acute bony abnormalities.  We discussed risks and benefits of draining the superficial hematoma and he gives verbal consent.  Under clean conditions, an 18-gauge needle and a 5 cc syringe are used to aspirate about 2 mL of blood from the hematoma on the dorsum of the left great toe.  Bandages applied and wound care is explained to the patient.  The bandage that is applied is also applied over the toenail so it will split the toenail onto the nailbed.  Toradol  injection is given here and Toradol  tabs are sent to the pharmacy.  He will ice and elevate the foot.  Tdap is given as he does not know when his last tetanus was  Work note is provided Final Clinical Impressions(s) / UC Diagnoses   Final diagnoses:  Great toe pain, left  Pain in left toe(s)  Abrasion of left foot, initial encounter     Discharge Instructions      The x-rays did not show any broken bones.  You have been given a shot of Toradol  30 mg today.  Ketorolac  10 mg tablets--take 1 tablet every 6 hours as needed for pain.  This is the same medicine that is in the shot we just gave you  Wash the cuts on your toes 2 times daily with soapy water and then put new triple antibiotic ointment on.  Keep some sort of bandage on your big toe over the toenail, so that it keeps  the toenail from getting lifted off if it catches on anything.  Ice and elevate in these next couple of days.     ED Prescriptions     Medication Sig Dispense Auth. Provider   ketorolac  (TORADOL ) 10 MG tablet Take 1 tablet (10 mg total) by mouth every 6 (six) hours as needed (pain). 20 tablet Minda Faas K, MD      PDMP not reviewed this encounter.   Vonna Sharlet POUR, MD 02/02/24 [phone]

## 2024-02-02 NOTE — Discharge Instructions (Signed)
 The x-rays did not show any broken bones.  You have been given a shot of Toradol  30 mg today.  Ketorolac  10 mg tablets--take 1 tablet every 6 hours as needed for pain.  This is the same medicine that is in the shot we just gave you  Wash the cuts on your toes 2 times daily with soapy water and then put new triple antibiotic ointment on.  Keep some sort of bandage on your big toe over the toenail, so that it keeps the toenail from getting lifted off if it catches on anything.  Ice and elevate in these next couple of days.

## 2024-06-01 ENCOUNTER — Ambulatory Visit (INDEPENDENT_AMBULATORY_CARE_PROVIDER_SITE_OTHER): Payer: MEDICAID | Admitting: Clinical

## 2024-06-01 DIAGNOSIS — F331 Major depressive disorder, recurrent, moderate: Secondary | ICD-10-CM | POA: Diagnosis not present

## 2024-06-01 DIAGNOSIS — F1020 Alcohol dependence, uncomplicated: Secondary | ICD-10-CM

## 2024-06-07 NOTE — Progress Notes (Signed)
 Comprehensive Clinical Assessment (CCA) Note  06/01/2024 Eugene Franco [id number]  Chief Complaint:  Chief Complaint  Patient presents with   Alcohol  Problem   Anxiety   Depression   Visit Diagnosis:  MDD, recurrent episode, moderate with anxious distress Alcohol  use disorder, severe   Tx Recommendations: Referral to in-house duly licensed mental health and substance use counselor to determine proper level of care before beginning SA IOP.   CCA Biopsychosocial Intake/Chief Complaint:  client reported he is referred by his own referral. client reported he went to [facility] in [place] and then went to [facility] [place] for detox for alcohol  use. client reported he completed detox in early october 2025. client reported he has a history of alcohol  use disorder and generalizd anxiety disorder. client reported he was previously prescribed trazadone and lexapro . client reported he had a DWI in 2004. client reported he has a hearing soon to see about having his lisecne reinstated.  Current Symptoms/Problems: client reported he is nervous alot, depressed mood, hard for him to get up and have the motivation to get started with his day  Patient Reported Schizophrenia/Schizoaffective Diagnosis in Past: No  Strengths: No data recorded Preferences: counseling and psychiatry  Abilities: engage in treatment planning  Type of Services Patient Feels are Needed: SAIOP/ Individual counseling  Initial Clinical Notes/Concerns: client reported he was also treated at [facility] in 2024 for alcohol  use.   Mental Health Symptoms Depression:  Change in energy/activity   Duration of Depressive symptoms: Greater than two weeks   Mania:  None   Anxiety:   Tension   Psychosis:  None   Duration of Psychotic symptoms: No data recorded  Trauma:  None   Obsessions:  None   Compulsions:  None   Inattention:  None   Hyperactivity/Impulsivity:  None   Oppositional/Defiant Behaviors:  None   Emotional  Irregularity:  None   Other Mood/Personality Symptoms:  No data recorded   Mental Status Exam Appearance and self-care  Stature:  Average   Weight:  Average weight   Clothing:  Casual   Grooming:  Normal   Cosmetic use:  Age appropriate   Posture/gait:  Normal   Motor activity:  Not Remarkable   Sensorium  Attention:  Normal   Concentration:  Normal   Orientation:  X5   Recall/memory:  Normal   Affect and Mood  Affect:  Congruent   Mood:  Euthymic   Relating  Eye contact:  Normal   Facial expression:  Responsive   Attitude toward examiner:  Cooperative   Thought and Language  Speech flow: Clear and Coherent   Thought content:  Appropriate to Mood and Circumstances   Preoccupation:  None   Hallucinations:  None   Organization:  No data recorded  [employer] of Knowledge:  Good   Intelligence:  Average   Abstraction:  Normal   Judgement:  Good   Reality Testing:  Adequate   Insight:  Good   Decision Making:  Normal   Social Functioning  Social Maturity:  Responsible   Social Judgement:  Normal   Stress  Stressors:  Family conflict   Coping Ability:  Resilient   Skill Deficits:  Activities of daily living   Supports:  Friends/Service system; Family (mom, sisters, brothers)     Religion: Religion/Spirituality Are You A Religious Person?: No  Leisure/Recreation: Leisure / Recreation Do You Have Hobbies?: No  Exercise/Diet: Exercise/Diet Do You Exercise?: No Have You Gained or Lost A Significant Amount of Weight in  the Past Six Months?: No Do You Follow a Special Diet?: No Do You Have Any Trouble Sleeping?: Yes Explanation of Sleeping Difficulties: client reported he is getting 3 to 4 hours of sleep. client reported he wakes up frequently.   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   CCA Family/Childhood History Family and Relationship History: Family history Marital status: Married Number of Years Married: 14 Additional relationship information: client reported his alcohol  use has affected his family relationship with his marriage and his children. client reported he does not know where he stands with his wife. client reported when his dad passed alcohol   was a form of self medicating. client reported how his wife talks to him sometimes is a form of toxicity which is a trigger. Are you sexually active?: Yes Does patient have children?: Yes How many children?: 3 How is patient's relationship with their children?: cient reported having a 26 yo son, 3 son, 62 year old sons  Childhood History:  Childhood History Additional childhood history information: client reported he is from [facility]. client reported he was raised by both parents. client reported his childhood was excellent. Patient's description of current relationship with people who raised him/her: client reported his father passed in 2005. Does patient have siblings?: Yes Description of patient's current relationship with siblings: 4 sisters 4 brothers. client reported one of his brothers recently passed. Did patient suffer any verbal/emotional/physical/sexual abuse as a child?: No Did patient suffer from severe childhood neglect?: No Has patient ever been sexually abused/assaulted/raped as an adolescent or adult?: No Was the patient ever a victim of a crime or a disaster?: No Witnessed domestic violence?: No Has patient been affected by domestic violence as an adult?: No  Child/Adolescent Assessment:     CCA Substance Use Alcohol /Drug Use: Alcohol  / Drug Use History of alcohol  / drug use?:  Yes Negative Consequences of Use: Personal relationships Substance #1 Name of Substance 1: alcohol : beer and liquor 1 - Age of First Use: ukn 1 - Amount (size/oz): 3 pack of tall can beer and half pint ofliquor 1 - Frequency: daily 1 - Duration: years 1 - Last Use / Amount: 05/31/2024 1 - Method of Aquiring: buy for himself 1- Route of Use: oral                       ASAM's:  Six Dimensions of Multidimensional Assessment  Dimension 1:  Acute Intoxication and/or Withdrawal Potential:   Dimension 1:  Description of individual's past and current experiences of substance use and withdrawal: Client reported he completed detox with [facility] recovery services and October 2025.  Dimension 2:  Biomedical Conditions and Complications:   Dimension 2:  Description of patient's biomedical conditions and  complications: Client reported no medical conditions.  Dimension 3:  Emotional, Behavioral, or Cognitive Conditions and Complications:  Dimension 3:  Description of emotional, behavioral, or cognitive conditions and complications: Client reported anxiety and depressive symptoms without suicidal ideations.  Dimension 4:  Readiness to Change:  Dimension 4:  Description of Readiness to Change criteria: Client is in the action stage of change  Dimension 5:  Relapse, Continued use, or Continued Problem Potential:  Dimension 5:  Relapse, continued  use, or continued problem potential critiera description: Client reported he has used alcohol  daily since completing detox in October 2025  Dimension 6:  Recovery/Living Environment:  Dimension 6:  Recovery/Iiving environment criteria description: Client reported he does have a positive support at home and from his social supports.  ASAM Severity Score: ASAM's Severity Rating Score: 6  ASAM Recommended Level of Treatment: ASAM Recommended Level of Treatment: Level II Intensive Outpatient Treatment   Substance use Disorder (SUD) Substance Use Disorder (SUD)   Checklist Symptoms of Substance Use: Continued use despite having a persistent/recurrent physical/psychological problem caused/exacerbated by use, Evidence of tolerance, Presence of craving or strong urge to use, Continued use despite persistent or recurrent social, interpersonal problems, caused or exacerbated by use  Recommendations for Services/Supports/Treatments: Recommendations for Services/Supports/Treatments Recommendations For Services/Supports/Treatments: IOP (Intensive Outpatient Program), Individual Therapy  DSM5 Diagnoses: Patient Active Problem List   Diagnosis Date Noted   C7 radiculopathy 08/20/2021   Compression neuropathy of left upper extremity 08/19/2021   Cervical radiculopathy 07/09/2021   Insomnia 03/15/2021   Anxiety state 03/15/2021   Alcohol  use disorder, severe, dependence (HCC) 08/30/2020   Thrombosed external hemorrhoid 02/10/2013   Hypertension 12/30/2010   Alcohol  ingestion of one to four drinks per day 12/30/2010   Tobacco use disorder 12/30/2010   Back pain 12/30/2010   Annual physical exam 12/02/2010    Patient Centered Plan: Patient is on the following Treatment Plan(s):  Anxiety   Referrals to Alternative Service(s): Referred to Alternative Service(s):   Place:   Date:   Time:    Referred to Alternative Service(s):   Place:   Date:   Time:    Referred to Alternative Service(s):   Place:   Date:   Time:    Referred to Alternative Service(s):   Place:   Date:   Time:      Collaboration of Care: Referral or follow-up with counselor/therapist AEB referral to duly licensed mental health and substance use counselor via [facility] outpatient department.  Patient/Guardian was advised Release of Information must be obtained prior to any record release in order to collaborate their care with an outside provider. Patient/Guardian was advised if they have not already done so to contact the registration department to sign all necessary forms in order for us  to  release information regarding their care.   Consent: Patient/Guardian gives verbal consent for treatment and assignment of benefits for services provided during this visit. Patient/Guardian expressed understanding and agreed to proceed.   Kianah Harries Y Kenichi Cassada, LCSW

## 2024-06-08 ENCOUNTER — Telehealth (HOSPITAL_COMMUNITY): Payer: Self-pay

## 2024-06-08 NOTE — Telephone Encounter (Signed)
 Eugene Cozart, LCSW referred this pt to this dually licensed therapist for therapy. This therapist calls Eugene Franco and reached voice mail.  Therapist reaches voice mail and leaves a HIPAA compliant message requesting a return call.  Darice Simpler, MS, LMFT, LCAS

## 2024-06-09 ENCOUNTER — Telehealth (HOSPITAL_COMMUNITY): Payer: Self-pay

## 2024-06-09 NOTE — Telephone Encounter (Signed)
 Ho calls this a.m. and asks to be scheduled for therapy wit this therapist. Therapist returns his call and confirms she is speaking to the correct person by obtaining two verifiers. Therapist offers him an appointment on Jul 06, 2024 at 1pm and he accepts.  Darice Simpler, MS, LMFT, LCAS

## 2024-07-06 ENCOUNTER — Ambulatory Visit (INDEPENDENT_AMBULATORY_CARE_PROVIDER_SITE_OTHER): Payer: MEDICAID

## 2024-07-06 ENCOUNTER — Encounter (HOSPITAL_COMMUNITY): Payer: Self-pay

## 2024-07-06 DIAGNOSIS — F1220 Cannabis dependence, uncomplicated: Secondary | ICD-10-CM

## 2024-07-06 DIAGNOSIS — F1020 Alcohol dependence, uncomplicated: Secondary | ICD-10-CM

## 2024-07-06 DIAGNOSIS — F1094 Alcohol use, unspecified with alcohol-induced mood disorder: Secondary | ICD-10-CM

## 2024-07-06 NOTE — Progress Notes (Addendum)
 THERAPIST PROGRESS NOTE  Session Time: 12:00 pm to 12:30 pm  Type of Therapy: Individual   Therapist Response/Interventions: psycho-education on how alcohol  interferes with sleep  Therapy Goals: Problem: Substance Use  Dates: Start:  07/06/24    Disciplines: Interdisciplinary, PROVIDER  Goal: Eugene Franco will engage in monthly therapy sessions to identify at least 3 personal life goals that are currently impacted by his drinking, moving from Not ready stance to a intent to change status.  Dates: Start:  07/06/24   Expected End:  12/04/24    Description: Eugene Franco give this therapist verbal permission to electronically sign his Care Plan on his behalf.  Disciplines: Interdisciplinary, PROVIDER  Intervention: Therapist will use motivational interviewing, CBT. psychoeducation  Dates: Start:  07/06/24       Summary: This is the first therapy session with Eugene Franco. He comes in person and says he needs his medication. He says he his PCP  Alston Provost)give him Citalopram, Trazodone  and something to help his cravings for alcohol .  He wants to get he a specialist that deals with behavioral medications.  Eugene Franco says he is currently living away from his wife and child. He says the reason for this is that he would get mouthy and argue with his wife. He states that he wants to work on getting himself better because he wants to go back home.  He says he does not remember suffering from depression previous to his use of alcohol  and cannabis.  Eugene Franco reports he [place], was an glass blower/designer, played football, wrestled and plays chess.  He states that after his father passed away in 07-17-2003, he found himself crying and reports this has been quite difficult on him to lose his father.  Visit Diagnosis:Alcohol  Use Disorder, Severe, Cannabis Use Disorder, Severe, Alcohol  Induced Depressive Disorder, R/O Generalized Anxiety Disorder  Suicidal/Homicidal: denies  Plan: Eugene Franco will return for in person therapy with  this therapist on 07-13-24 at 3 pm.  He states he will walk in to establish medication management.   Collaboration of Care: n/a  Patient/Guardian was advised Release of Information must be obtained prior to any record release in order to collaborate their care with an outside provider. Patient/Guardian was advised if they have not already done so to contact the registration department to sign all necessary forms in order for us  to release information regarding their care.   Consent: Patient/Guardian gives verbal consent for treatment and assignment of benefits for services provided during this visit. Patient/Guardian expressed understanding and agreed to proceed.   Darice Simpler, MS., LMFT, LCAS

## 2024-08-03 ENCOUNTER — Ambulatory Visit (HOSPITAL_COMMUNITY): Payer: MEDICAID

## 2024-08-03 DIAGNOSIS — F331 Major depressive disorder, recurrent, moderate: Secondary | ICD-10-CM

## 2024-08-03 DIAGNOSIS — F1020 Alcohol dependence, uncomplicated: Secondary | ICD-10-CM

## 2024-08-03 DIAGNOSIS — F1220 Cannabis dependence, uncomplicated: Secondary | ICD-10-CM

## 2024-08-03 DIAGNOSIS — F1094 Alcohol use, unspecified with alcohol-induced mood disorder: Secondary | ICD-10-CM

## 2024-08-12 ENCOUNTER — Ambulatory Visit (HOSPITAL_COMMUNITY): Payer: MEDICAID

## 2024-08-27 ENCOUNTER — Ambulatory Visit (HOSPITAL_COMMUNITY): Payer: MEDICAID | Admitting: Psychiatry
